# Patient Record
Sex: Female | Born: 1960 | Race: White | Hispanic: No | Smoking: Never smoker
Health system: Southern US, Community
[De-identification: ages and names within clinical notes are randomized; demographics above are authoritative.]

## PROBLEM LIST (undated history)

## (undated) DIAGNOSIS — E559 Vitamin D deficiency, unspecified: Secondary | ICD-10-CM

## (undated) DIAGNOSIS — I8393 Asymptomatic varicose veins of bilateral lower extremities: Secondary | ICD-10-CM

## (undated) DIAGNOSIS — N979 Female infertility, unspecified: Secondary | ICD-10-CM

## (undated) DIAGNOSIS — F419 Anxiety disorder, unspecified: Secondary | ICD-10-CM

## (undated) DIAGNOSIS — H539 Unspecified visual disturbance: Secondary | ICD-10-CM

## (undated) DIAGNOSIS — K8020 Calculus of gallbladder without cholecystitis without obstruction: Secondary | ICD-10-CM

## (undated) DIAGNOSIS — L309 Dermatitis, unspecified: Secondary | ICD-10-CM

## (undated) DIAGNOSIS — R439 Unspecified disturbances of smell and taste: Secondary | ICD-10-CM

## (undated) DIAGNOSIS — G4700 Insomnia, unspecified: Secondary | ICD-10-CM

## (undated) DIAGNOSIS — I10 Essential (primary) hypertension: Secondary | ICD-10-CM

## (undated) DIAGNOSIS — R7303 Prediabetes: Secondary | ICD-10-CM

## (undated) DIAGNOSIS — G902 Horner's syndrome: Secondary | ICD-10-CM

## (undated) HISTORY — DX: Dermatitis, unspecified: L30.9

## (undated) HISTORY — DX: Calculus of gallbladder without cholecystitis without obstruction: K80.20

## (undated) HISTORY — DX: Vitamin D deficiency, unspecified: E55.9

## (undated) HISTORY — DX: Unspecified visual disturbance: H53.9

## (undated) HISTORY — DX: Essential (primary) hypertension: I10

## (undated) HISTORY — DX: Unspecified disturbances of smell and taste: R43.9

## (undated) HISTORY — DX: Prediabetes: R73.03

## (undated) HISTORY — DX: Horner's syndrome: G90.2

## (undated) HISTORY — DX: Anxiety disorder, unspecified: F41.9

## (undated) HISTORY — DX: Female infertility, unspecified: N97.9

## (undated) HISTORY — DX: Insomnia, unspecified: G47.00

## (undated) HISTORY — DX: Asymptomatic varicose veins of bilateral lower extremities: I83.93

---

## 1998-06-05 ENCOUNTER — Other Ambulatory Visit: Admission: RE | Admit: 1998-06-05 | Discharge: 1998-06-05 | Payer: Self-pay | Admitting: *Deleted

## 1998-08-05 ENCOUNTER — Emergency Department (HOSPITAL_COMMUNITY): Admission: EM | Admit: 1998-08-05 | Discharge: 1998-08-05 | Payer: Self-pay | Admitting: Emergency Medicine

## 1998-08-06 ENCOUNTER — Encounter: Payer: Self-pay | Admitting: Emergency Medicine

## 1999-07-07 ENCOUNTER — Other Ambulatory Visit: Admission: RE | Admit: 1999-07-07 | Discharge: 1999-07-07 | Payer: Self-pay | Admitting: *Deleted

## 2001-05-31 ENCOUNTER — Other Ambulatory Visit: Admission: RE | Admit: 2001-05-31 | Discharge: 2001-05-31 | Payer: Self-pay | Admitting: Obstetrics and Gynecology

## 2002-04-22 ENCOUNTER — Encounter: Admission: RE | Admit: 2002-04-22 | Discharge: 2002-04-22 | Payer: Self-pay | Admitting: Family Medicine

## 2002-04-23 ENCOUNTER — Encounter: Payer: Self-pay | Admitting: Family Medicine

## 2002-04-23 ENCOUNTER — Encounter: Admission: RE | Admit: 2002-04-23 | Discharge: 2002-04-23 | Payer: Self-pay | Admitting: Family Medicine

## 2003-06-11 ENCOUNTER — Other Ambulatory Visit: Admission: RE | Admit: 2003-06-11 | Discharge: 2003-06-11 | Payer: Self-pay | Admitting: Obstetrics and Gynecology

## 2003-07-11 ENCOUNTER — Encounter: Payer: Self-pay | Admitting: Obstetrics and Gynecology

## 2003-07-11 ENCOUNTER — Ambulatory Visit (HOSPITAL_COMMUNITY): Admission: RE | Admit: 2003-07-11 | Discharge: 2003-07-11 | Payer: Self-pay | Admitting: Obstetrics and Gynecology

## 2003-08-12 ENCOUNTER — Encounter: Payer: Self-pay | Admitting: Obstetrics and Gynecology

## 2003-08-12 ENCOUNTER — Ambulatory Visit (HOSPITAL_COMMUNITY): Admission: RE | Admit: 2003-08-12 | Discharge: 2003-08-12 | Payer: Self-pay | Admitting: Obstetrics and Gynecology

## 2003-09-09 ENCOUNTER — Ambulatory Visit (HOSPITAL_COMMUNITY): Admission: RE | Admit: 2003-09-09 | Discharge: 2003-09-09 | Payer: Self-pay | Admitting: Obstetrics and Gynecology

## 2003-09-09 ENCOUNTER — Encounter: Payer: Self-pay | Admitting: Obstetrics and Gynecology

## 2003-09-19 ENCOUNTER — Ambulatory Visit (HOSPITAL_COMMUNITY): Admission: RE | Admit: 2003-09-19 | Discharge: 2003-09-19 | Payer: Self-pay | Admitting: Obstetrics and Gynecology

## 2003-09-26 ENCOUNTER — Inpatient Hospital Stay (HOSPITAL_COMMUNITY): Admission: AD | Admit: 2003-09-26 | Discharge: 2003-09-26 | Payer: Self-pay | Admitting: Obstetrics and Gynecology

## 2003-10-07 ENCOUNTER — Ambulatory Visit (HOSPITAL_COMMUNITY): Admission: RE | Admit: 2003-10-07 | Discharge: 2003-10-07 | Payer: Self-pay | Admitting: Obstetrics and Gynecology

## 2003-10-07 ENCOUNTER — Encounter: Admission: RE | Admit: 2003-10-07 | Discharge: 2003-10-07 | Payer: Self-pay | Admitting: Obstetrics and Gynecology

## 2003-10-14 ENCOUNTER — Encounter: Admission: RE | Admit: 2003-10-14 | Discharge: 2003-10-14 | Payer: Self-pay | Admitting: Obstetrics and Gynecology

## 2003-10-21 ENCOUNTER — Encounter: Admission: RE | Admit: 2003-10-21 | Discharge: 2003-10-21 | Payer: Self-pay | Admitting: Obstetrics and Gynecology

## 2003-10-24 ENCOUNTER — Encounter: Admission: RE | Admit: 2003-10-24 | Discharge: 2003-10-24 | Payer: Self-pay | Admitting: Obstetrics and Gynecology

## 2003-10-27 ENCOUNTER — Encounter: Admission: RE | Admit: 2003-10-27 | Discharge: 2003-10-27 | Payer: Self-pay | Admitting: Obstetrics and Gynecology

## 2003-10-27 ENCOUNTER — Inpatient Hospital Stay (HOSPITAL_COMMUNITY): Admission: AD | Admit: 2003-10-27 | Discharge: 2003-11-04 | Payer: Self-pay | Admitting: Obstetrics and Gynecology

## 2003-10-30 ENCOUNTER — Encounter (INDEPENDENT_AMBULATORY_CARE_PROVIDER_SITE_OTHER): Payer: Self-pay

## 2003-11-05 ENCOUNTER — Encounter: Admission: RE | Admit: 2003-11-05 | Discharge: 2003-12-05 | Payer: Self-pay | Admitting: Obstetrics and Gynecology

## 2003-11-29 IMAGING — US US OB FOLLOW-UP EACH ADDL GEST (MODIFY)
1 series · 17 of 28 positions shown · non-contrast
Comparison: none

CLINICAL DATA: Pregnancy induced hypertension.  Assess presentation and estimated fetal weight.

[Series 1: us ob re-eval · 17 of 58 slices shown]
[im 1/58]
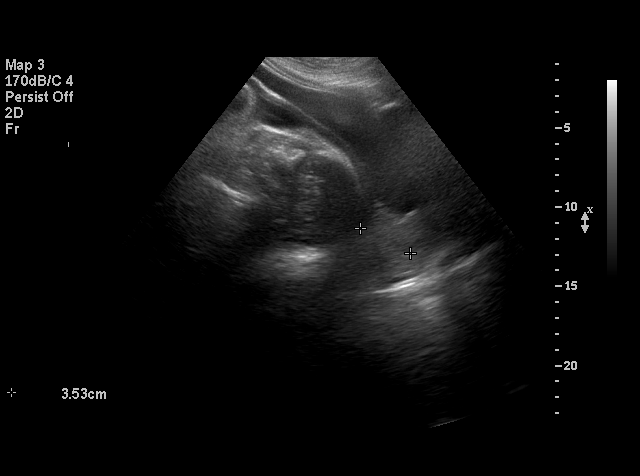
[im 5/58]
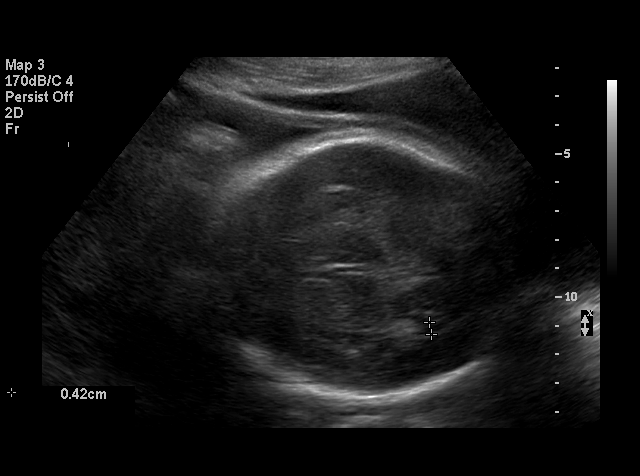
[im 9/58]
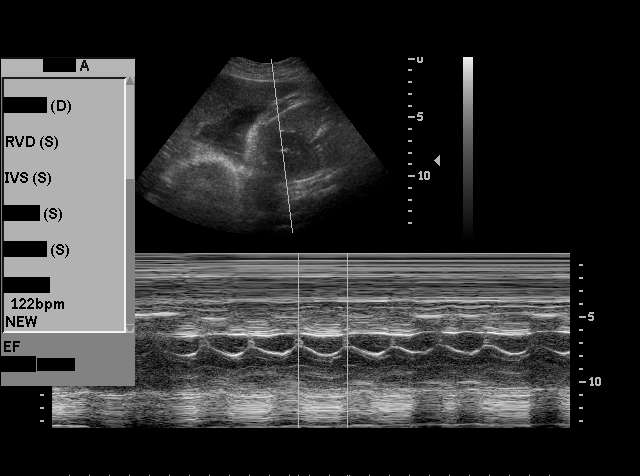
[im 11/58]
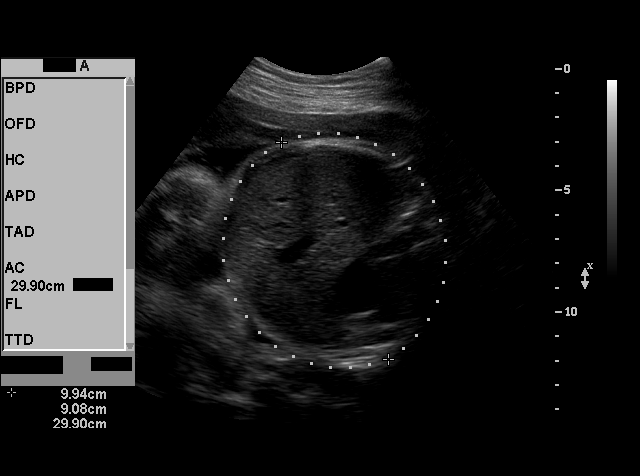
[im 15/58]
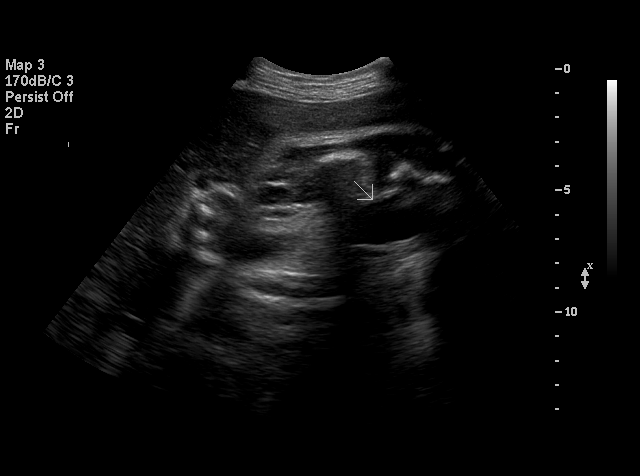
[im 20/58]
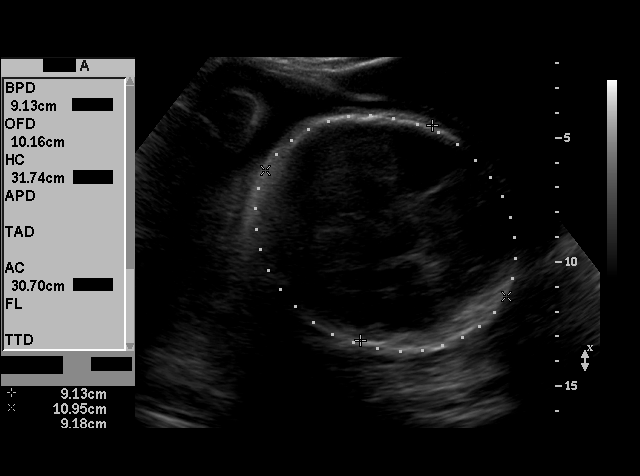
[im 22/58]
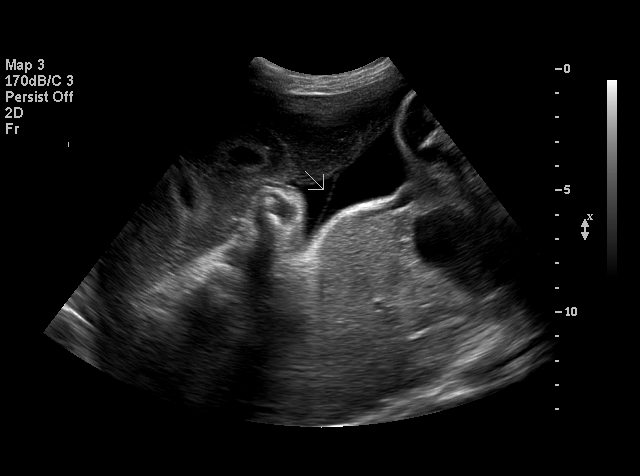
[im 26/58]
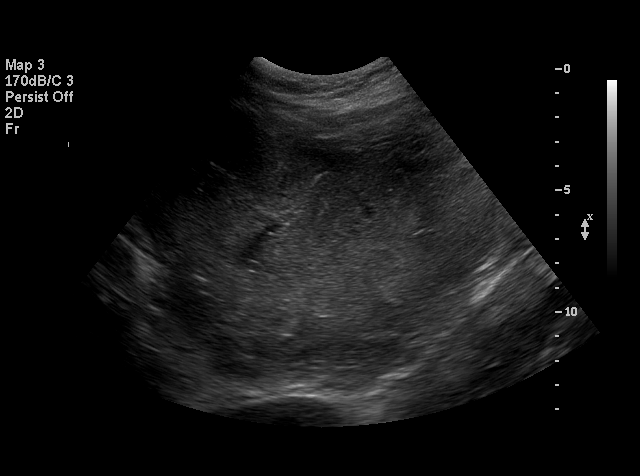
[im 30/58]
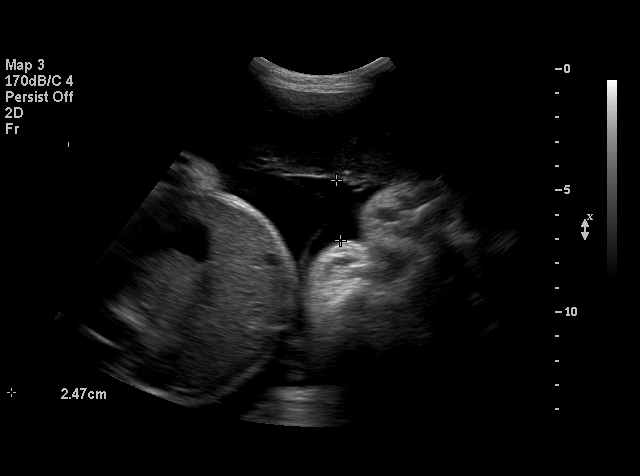
[im 32/58]
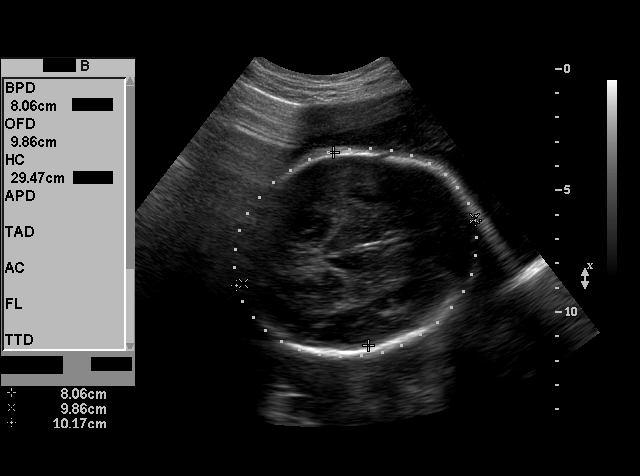
[im 36/58]
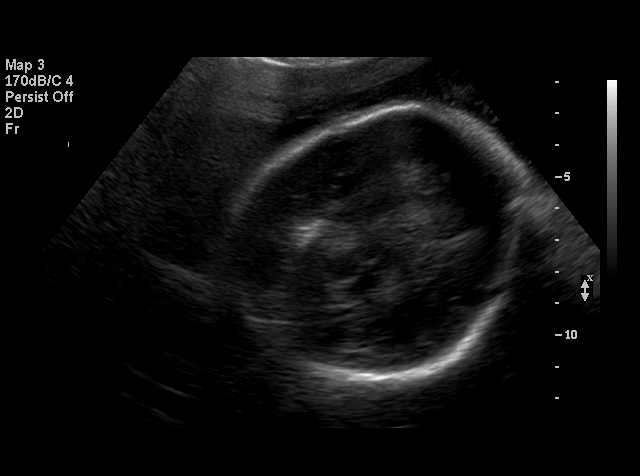
[im 39/58]
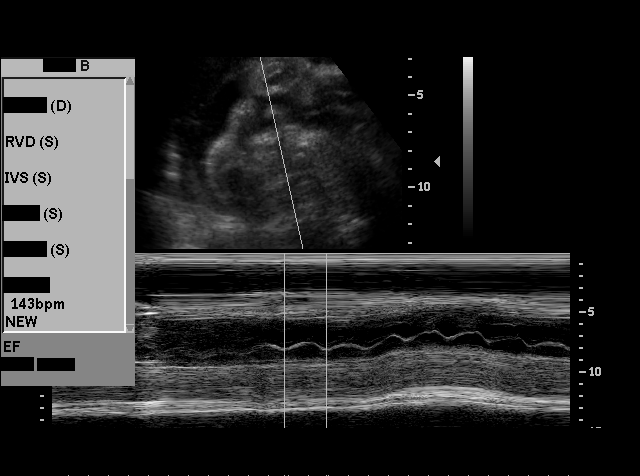
[im 43/58]
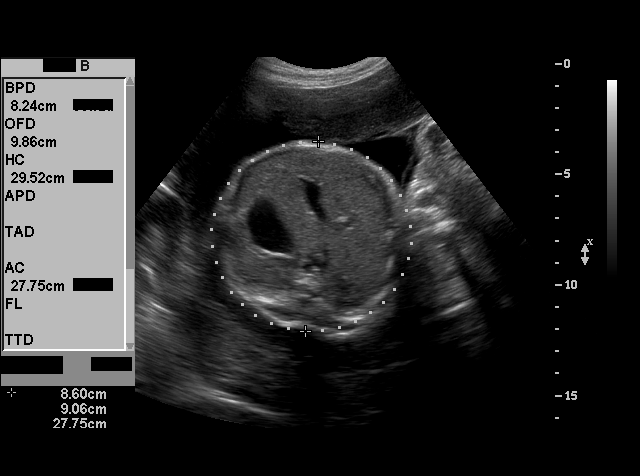
[im 47/58]
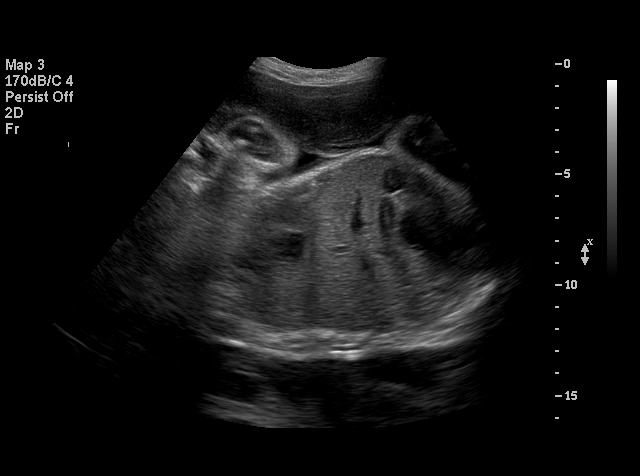
[im 49/58]
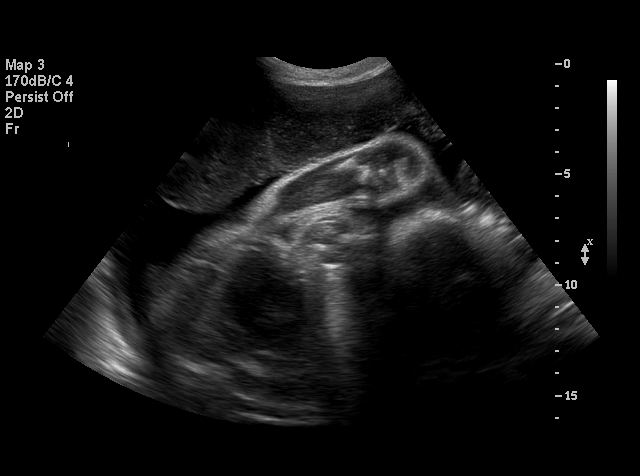
[im 53/58]
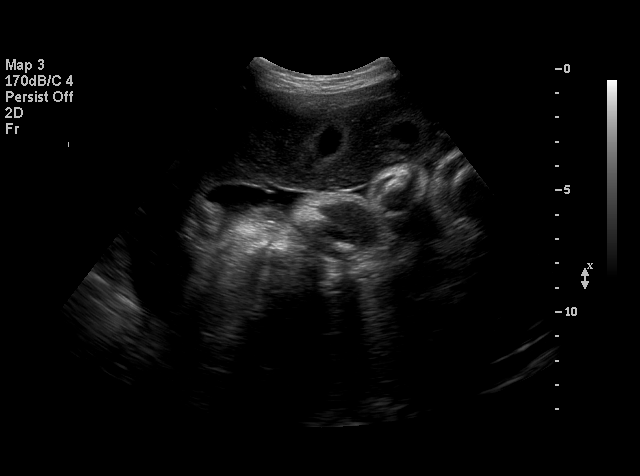
[im 58/58]
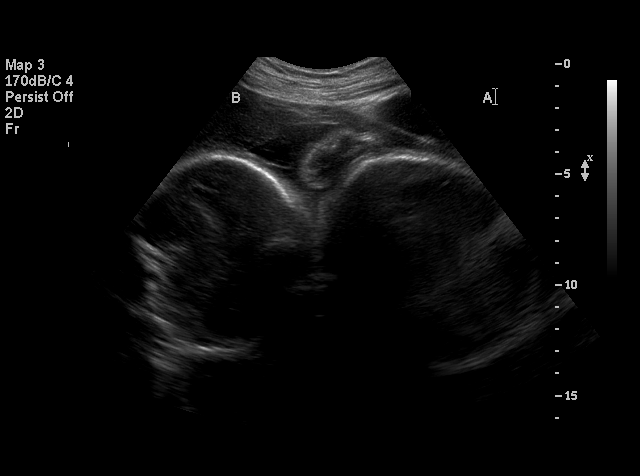

[17 of 28 positions shown; findings below may reference images not displayed]

TWIN OBSTETRICAL ULTRASOUND REEVALUATION
A living dizygotic/dichorionic/diamniotic twin gestation is present.  A separating membrane is seen.

TWIN A:
Heart Rate: 122
Movement:  Yes
Breathing:  Yes
Presentation:  Cephalic
Placental Location:  Anterior
Grade:  II
Previa:  No
Amniotic Fluid (Subjective):  Normal
Amniotic Fluid (Objective):   2.5 cm Vertical pocket 

FETAL BIOMETRY (TWIN A)
BPD:  8.9 cm  36 w 2 d
HC:  32.0 cm  36 w 1 d
AC:  30.5 cm  34 w 4 d
FL:  6.3 cm   32 w 6 d
MEAN GA:  35 w 0 d
1st US GA:  33 w 5 d (assigned)
EFW:  2429 g (H) 7668 ? 4833 g) For 34 weeks

FETAL ANATOMY (TWIN A)
Lateral Ventricles:  Visualized 
Thalami/CSP:  Previously seen 
Posterior Fossa:  Previously seen 
Nuchal Region:  N/A
Spine:  Previously seen 
4 Chamber Heart on L:  Visualized 
Stomach on Left:  Visualized 
3 Vessel Cord:  Visualized 
Cord Insertion Site:  Previously seen 
Kidneys:  Visualized 
Bladder:  Visualized   
Extremities:  Previously seen 

TWIN B:
Heart Rate:  143 
Movement:  Yes
Breathing:  Yes
Presentation:  Oblique with head on maternal right
Placental Location:  Posterior
Grade:  II
Previa:  No
Amniotic Fluid (Subjective):  Normal
Amniotic Fluid (Objective):   3.2 cm Vertical pocket 

FETAL BIOMETRY (TWIN B)
BPD:  8.2 cm  32 w 6 d
HC:  29.4 cm  32 w 4 d
AC:  29.0 cm  33 w 0 d
FL:  6.2 cm    32 w 0 d
MEAN GA:  32 w 5 d
1st US GA:  33 w 5 d (assigned)
EFW:  7883 g (H) 3989 ? 7668 g)  For 34 weeks

FETAL ANATOMY (TWIN B)
Lateral Ventricles:  Visualized 
Thalami/CSP:  Previously seen 
Posterior Fossa:  Previously seen 
Nuchal Region:  N/A
Spine:  Previously seen 
4 Chamber Heart on L:  Visualized 
Stomach on Left:  Visualized 
3 Vessel Cord:  Visualized 
Cord Insertion Site:  Previously seen 
Kidneys:  Visualized 
Bladder:  Visualized 
Extremities:  Previously seen 

Maternal Findings:
Cervix:  3.5 cm Transabdominally
IMPRESSION: Living dizygotic/dichorionic/diamniotic twin pregnancy of 33 weeks 5 days gestational age.  Twin A measures 35 weeks with a weight of 2429 grams falling between the 50th and 75th percentile for 34 weeks.  Twin B measures 32 weeks 5 days with a fetal weight of 7883 grams, falling between the 25th and 50th percentile for 34 weeks.  Growth is felt to be appropriate.
Normal fluid in each sac.
Twin A is in cephalic presentation in the inferior portion uterus.  Twin B is in oblique presentation with head on maternal right in the superior portion of the uterus.  
Normal cervical length of 3.5 cm.

## 2003-12-06 ENCOUNTER — Encounter: Admission: RE | Admit: 2003-12-06 | Discharge: 2004-01-05 | Payer: Self-pay | Admitting: Obstetrics and Gynecology

## 2004-01-06 ENCOUNTER — Encounter: Admission: RE | Admit: 2004-01-06 | Discharge: 2004-02-05 | Payer: Self-pay | Admitting: Obstetrics and Gynecology

## 2004-06-11 ENCOUNTER — Encounter: Admission: RE | Admit: 2004-06-11 | Discharge: 2004-06-11 | Payer: Self-pay | Admitting: Obstetrics and Gynecology

## 2004-06-28 ENCOUNTER — Other Ambulatory Visit: Admission: RE | Admit: 2004-06-28 | Discharge: 2004-06-28 | Payer: Self-pay | Admitting: Obstetrics and Gynecology

## 2004-07-01 ENCOUNTER — Encounter: Admission: RE | Admit: 2004-07-01 | Discharge: 2004-07-01 | Payer: Self-pay | Admitting: Obstetrics and Gynecology

## 2005-07-08 ENCOUNTER — Other Ambulatory Visit: Admission: RE | Admit: 2005-07-08 | Discharge: 2005-07-08 | Payer: Self-pay | Admitting: Obstetrics and Gynecology

## 2008-06-27 ENCOUNTER — Encounter: Admission: RE | Admit: 2008-06-27 | Discharge: 2008-06-27 | Payer: Self-pay | Admitting: Family Medicine

## 2008-07-24 ENCOUNTER — Other Ambulatory Visit: Admission: RE | Admit: 2008-07-24 | Discharge: 2008-07-24 | Payer: Self-pay | Admitting: Family Medicine

## 2008-08-01 ENCOUNTER — Encounter: Admission: RE | Admit: 2008-08-01 | Discharge: 2008-08-01 | Payer: Self-pay | Admitting: Family Medicine

## 2010-12-25 ENCOUNTER — Encounter: Payer: Self-pay | Admitting: Obstetrics and Gynecology

## 2011-02-23 ENCOUNTER — Other Ambulatory Visit (HOSPITAL_COMMUNITY)
Admission: RE | Admit: 2011-02-23 | Discharge: 2011-02-23 | Disposition: A | Discharge status: Home / Self Care | Payer: BC Managed Care – PPO | Source: Ambulatory Visit | Attending: Family Medicine | Admitting: Family Medicine

## 2011-02-23 ENCOUNTER — Other Ambulatory Visit: Payer: Self-pay | Admitting: Family Medicine

## 2011-02-23 DIAGNOSIS — Z0000 Encounter for general adult medical examination without abnormal findings: Secondary | ICD-10-CM | POA: Insufficient documentation

## 2011-04-22 NOTE — H&P (Signed)
Brenda Rubio, Brenda Rubio                      ACCOUNT NO.:  [id number]   MEDICAL RECORD NO.:  [id number]                   PATIENT TYPE:  INP   LOCATION:  NA                                   FACILITY:  [facility]   PHYSICIAN:  Huel Cote, M.D.              DATE OF BIRTH:  11-10-1961   DATE OF ADMISSION:  10/27/2003  DATE OF DISCHARGE:                                HISTORY & PHYSICAL   REASON FOR ADMISSION:  Preeclampsia and a twin gestation.   HISTORY OF PRESENT ILLNESS:  The patient is a 50 year old, G1, P0, at 33  weeks and 3 days with a twin gestation conceived by in vitro fertilization  with donor eggs.  Brenda Rubio has been followed for hypertension with  pregnancy for the last several weeks, first noted at 29 weeks.  She was  placed on Aldomet 250 mg b.i.d. on  October 06, 2003, and had some response.  She also had a baseline 24-hour urine and baseline preeclamptic laboratories  performed, which were within normal limits.  Her 24-hour urine had 125 mg of  protein.  That was performed on October 07, 2003.  Currently the patient was  seen in the office today and had a blood pressure of 170/105.  Her weight  was notably increased at 197 pounds, which was approximately 10 pounds  heavier than last week this time.  She had been increased on her Aldomet to  500 mg b.i.d. last week and had gone to half days at work.  However, her  blood pressure did not appear to be responding to this.  Also, the patient  had 3+ proteinuria noted and this was entirely new for her.  She denied any  preeclamptic symptoms other than the significant lower extremity edema.  She  had no headaches, vision changes, or epigastric pain.  The last ultrasound  performed on October 07, 2003, revealed baby A to be vertex with an  estimated fetal weight of 1949 g and baby B was breech in the oblique  position with a weight of 1578 g and both appeared appropriately grown.   PAST OBSTETRICAL HISTORY:  None.   PRENATAL LABORATORY DATA:  A negative.  Antibody negative.  RPR nonreactive.  Rubella immune.  Hepatitis B surface antigen negative.  HIV negative.  GC  negative.  Chlamydia negative.  Group B Streptococcus is as yet unknown.   PAST GYNECOLOGICAL HISTORY:  Significant for premature ovarian failure.  The  patient did conceive with in vitro fertilization and donor eggs.   PAST MEDICAL HISTORY:  Iron deficiency anemia.  Otherwise none significant.   PAST SURGICAL HISTORY:  Wisdom teeth extraction only.   ALLERGIES:  PENICILLIN.   MEDICATIONS:  Currently the patient is taking Aldomet 500 mg p.o. b.i.d. and  her prenatal vitamins.   PHYSICAL EXAMINATION:  VITAL SIGNS:  The patient's blood pressure in the  office was 150/100.  EXTREMITIES:  She had 3+  pitting edema of the entire lower extremities from  the knees down.  DTRs were 2+ bilaterally.  CARDIAC:  Regular rate and rhythm.  LUNGS:  Clear.  ABDOMEN:  Gravida and nontender.  The fundal height is approximately 40.  She had a nonstress test which was reactive in the morning of admission.   IMPRESSION AND PLAN:  Given the patient's lack of response to increasing her  Aldomet and her weight gain with new proteinuria, she is being admitted for  presumptive preeclampsia.  She will have laboratories obtained upon  admission and a 24-hour urine begun, as well as blood pressure observation.  Should the patient's blood pressure appeared excessively elevated, we will  begin her on magnesium and potentially move towards delivery.  We will also  proceed with betamethasone injection now and again in 24 hours.  Pending the  results of her laboratories, we will make a decision whether delivery needs  to be imminent or if we can hold off to allow the fetuses more time to  mature.                                               Huel Cote, M.D.    KR/MEDQ  D:  10/27/2003  T:  10/27/2003  Job:  [id number]

## 2011-04-22 NOTE — Discharge Summary (Signed)
Brenda Rubio, Brenda Rubio                      ACCOUNT NO.:  [id number]   MEDICAL RECORD NO.:  [id number]                   PATIENT TYPE:  INP   LOCATION:  9110                                 FACILITY:  [facility]   PHYSICIAN:  Huel Cote, M.D.              DATE OF BIRTH:  Oct 25, 1961   DATE OF ADMISSION:  10/27/2003  DATE OF DISCHARGE:  11/04/2003                                 DISCHARGE SUMMARY   DISCHARGE DIAGNOSES:  1. Twin gestation at 34 weeks delivered.  2. Pregnancy conceived with donor egg in vitro fertilization.  3. Severe preeclampsia.  4. Failed induction.  5. Status post primary low transverse cesarean section.   DISCHARGE MEDICATIONS:  1. Motrin 600 mg p.o. q.6h.  2. Percocet one to two tablets p.o. q.4h. p.r.n.  3. Niferex 150 mg p.o. daily.  4. Labetalol 200 mg p.o. b.i.d. with blood pressure check in one week.   DISCHARGE FOLLOW-UP:  The patient is to return to the office in one week for  a blood pressure check or call in her blood pressures from her visiting  nurse.   HOSPITAL COURSE:  The patient is a 50 year old G1 P0 who is admitted at 51  weeks three days with a twin gestation conceived by in vitro fertilization  with donor eggs.  She had been followed for PIH over the last several weeks,  first noted at [redacted] weeks gestation, and was placed on Aldomet 250 mg b.i.d.  with some response.  She had a baseline 24-hour urine and preeclamptic labs  which were normal and her 24-hour urine at that time had 125 mg of protein  on October 07, 2003.  When the patient came in for her routine visit she was  noted to have a blood pressure of 170/105 and her weight had increased 10  pounds over one week.  Her urine was also significant for 3+ proteinuria.  She denied any preeclamptic symptoms, vision changes, or other issues.  Her  last ultrasound performed on November 2 revealed concordant twins with good  growth, baby A vertex and baby B breech.  Past obstetrical  history:  None.  Prenatal labs:  A negative, antibody negative, RPR nonreactive,  rubella immune, hepatitis B surface antigen negative, HIV negative, GC  negative, chlamydia negative, group B strep unknown.  Past gynecological  history:  Premature ovarian failure with this pregnancy conceived with in  vitro fertilization and donor eggs.  Past medical history:  Iron deficiency  anemia.  Past surgical history:  Wisdom teeth only.  Allergies:  PENICILLIN.  Medications:  The patient on admission was taking Aldomet 500 mg p.o. b.i.d.  On admission blood pressure was 150/100.  Cardiac exam was regular rate and  rhythm.  Lungs were clear.  Abdomen was gravid and nontender, fundal height  was 40.  She had 3+ pitting edema of the entire lower extremity from the  knee down.  The patient was admitted  with hospital course as follows.  She  received betamethasone and was given some IV labetalol to control blood  pressures which got as high as 170/110 and was placed on magnesium for  seizure prophylaxis.  The patient's labs on admission were significant for a  platelet count which was slightly diminished to 132, a uric acid was 6.7,  AST and ALT were 28 and 33, hemoglobin was 12.3.  The patient had labs which  were repeated serially given her platelet count, which stabilized at about  120,000.  After the patient had received betamethasone and this had been  onboard for 24 hours the decision was made to proceed with delivery, given  increasing pedal edema and no further significant benefits to be obtained by  maintaining the patient's pregnancy after betamethasone and 34 weeks had  been reached.  The patient was given attempts at ripening cervix with  Cervidil and Cytotec; however, her cervix never responded and the patient's  edema had progressed to 3+ all the way up to her groin area such that it was  almost impossible that she bend her lower extremities.  Given her worsening  blood pressures which were  now 170/106 on labetalol and a cervix which was  unchanged by attempts at ripening for 36 hours the patient's situation was  discussed with she and her husband and we recommended proceeding with a  cesarean section given that she was remote from delivery and her condition  seemed to be worsening.  The patient agreed and she underwent a low  transverse cesarean section on October 30, 2003 and was delivered of two  vigorous infants.  Twin A delivered vertex with a nuchal cord x1, was a  vigorous female, Apgars were 8 and 9, weight was 4 pounds 13 ounces.  Twin B  delivered vertex, weight was  4 pounds 0 ounces, with Apgars of 8 and 9, a vigorous female infant.  Placenta was delivered manually.  She was placed on magnesium postpartum and  immediately postpartum had several large clots evacuated from the uterus  with a postoperative day #1 hemoglobin of 7.2.  Her labs did bump slightly  with an SGOT of 61, SGPT of 48, platelets were stable at 141, creatinine was  1.1.  We continued to follow her labs and continued magnesium for over 24  hours for seizure prophylaxis until the patient began to diurese well.  Her  labs stabilized and she began to tolerate a regular diet and otherwise was  doing very well with her pain control.  She had a low-grade temperature of  100.4 prior to discharge; however, the day of discharge this had not been  present for approximately 24 hours.  Her blood pressure was stable at 150 to  160 over 90s on p.o. labetalol 200 b.i.d.  Her incision was clean, dry, and  intact.  There was excellent urine output and no further issues.  Her  hematocrit stabilized at 18.5 and though the patient was given the option of  blood transfusion she felt very well clinically and declined this option.  Therefore, on November 30 the patient was tolerating a regular diet, her  pain was well controlled, she was ambulating without difficulty, and was felt stable for discharge home.  Therefore,  she was discharged with follow-  up in the office planned in one week for a blood pressure check.  Huel Cote, M.D.    KR/MEDQ  D:  12/03/2003  T:  12/03/2003  Job:  [id number]

## 2011-04-22 NOTE — Op Note (Signed)
NAMEMACKENSI, MAHADEO                      ACCOUNT NO.:  [id number]   MEDICAL RECORD NO.:  [id number]                   PATIENT TYPE:  INP   LOCATION:  9175                                 FACILITY:  [facility]   PHYSICIAN:  Huel Cote, M.D.              DATE OF BIRTH:  1961/11/13   DATE OF PROCEDURE:  10/30/2003  DATE OF DISCHARGE:                                 OPERATIVE REPORT   PREOPERATIVE DIAGNOSES:  1. Twin gestation at 34 weeks.  2. Pregnancy conceived with donor egg in vitro fertilization.  3. Severe preeclampsia.  4. Failed induction.   POSTOPERATIVE DIAGNOSES:  1. Twin gestation at 34 weeks.  2. Pregnancy conceived with donor egg in vitro fertilization.  3. Severe preeclampsia.  4. Failed induction.   PROCEDURE:  Primary low transverse cesarean section.   SURGEON:  Huel Cote, M.D.   ANESTHESIA:  Spinal.   FLUIDS:  Estimated blood loss 800 mL, urine output approximately 100 mL  clear urine, IV fluids 1400 mL LR.   FINDINGS:  Vigorous twin infants were delivered.  Baby A was vertex with a  nuchal cord x1, Apgars were 8 and 9, weight was 4 pounds 13 ounces.  Baby B  was also delivered vertex, a vigorous female infant, Apgars were 8 and 9,  and weight was 4 pounds even.  Placenta delivered manually.  Cord clamp was  placed on A for identification.  Uterus, tubes, and ovaries were noted to be  normal.   PATHOLOGY:  The placenta was sent.   DESCRIPTION OF PROCEDURE:  The patient was taken to the operating room,  where spinal anesthesia was obtained without difficulty.  She was then  prepped and draped in the normal sterile fashion in the dorsal supine  position with a leftward tilt.  With the Foley catheter in place, a  Pfannenstiel skin incision was then made through the skin and carried  through to the underlying layer of fascia with Bovie cautery.  The fascia  was then nicked in the midline and the incision was extended laterally with  Mayo scissors.   The inferior aspect of the incision was grasped with Kocher  clamps, elevated, and dissected off the underlying rectus muscles.  In a  similar fashion the superior aspect of the fascia was taken off the rectus  muscles with the Bovie cautery.  The rectus muscles were separated in the  midline and the peritoneal cavity entered bluntly.  The peritoneal incision  was then extended both superiorly and inferiorly with careful attention to  avoid both bowel and bladder.  The bladder blade was then inserted and the  vesicouterine peritoneum identified and the bladder flap created with  Metzenbaum scissors.  With the bladder flap then under the bladder blade,  the lower uterine segment was incised in a transverse fashion and the  incision was extended manually.  The bag of water was noted to be clear up  on  entering the uterine cavity and the infant's head was delivered  atraumatically.  There was a nuchal cord that was reduced x1.  The infant  was bulb-suctioned with the remainder of the body delivered and cord clamped  and handed to the waiting pediatricians.  With the cord clamp in place,  attention was then turned to baby B, where the head was easily palpated  through the intact membranes.  The head was partially delivered to the  uterine incision and membranes ruptured with clear fluid also noted.  Baby  B, a vigorous female infant, was then delivered from the vertex presentation  without difficulty, her cord clamped and cut, and she was bulb-suctioned and  handed to the awaiting pediatricians as well.  Cord blood was then obtained  from cord A and cord B, and the placentas were delivered manually.  The  uterus was cleared of all clots and debris with a moist lap sponge and  contracted down nicely.  The uterine incision was then repaired with 0  Vicryl in a running locked fashion.  At the conclusion of the closure no  active bleeding was noted.  Ovaries and tubes were inspected and found to  be  normal bilaterally, and the abdomen and pelvis were irrigated and no  bleeding again noted.  Therefore, the rectus muscles were closed with  several interrupted sutures of 0 chromic.  The fascia was then closed with 0  Vicryl in a running fashion.  The subcutaneous tissue was reapproximated  with 3-0 Vicryl in a subcutaneous stitch, and finally the skin was closed  with staples.  Sponge, lap, and needle counts were correct x2 and the  patient was taken to the recovery room in stable condition.                                               Huel Cote, M.D.    KR/MEDQ  D:  10/30/2003  T:  10/30/2003  Job:  [id number]

## 2011-11-09 ENCOUNTER — Other Ambulatory Visit: Payer: Self-pay | Admitting: Family Medicine

## 2011-11-09 DIAGNOSIS — G44009 Cluster headache syndrome, unspecified, not intractable: Secondary | ICD-10-CM

## 2011-11-12 ENCOUNTER — Ambulatory Visit
Admission: RE | Admit: 2011-11-12 | Discharge: 2011-11-12 | Disposition: A | Discharge status: Home / Self Care | Payer: BC Managed Care – PPO | Source: Ambulatory Visit | Attending: Family Medicine | Admitting: Family Medicine

## 2011-11-12 DIAGNOSIS — G44009 Cluster headache syndrome, unspecified, not intractable: Secondary | ICD-10-CM

## 2011-11-12 MED ORDER — GADOBENATE DIMEGLUMINE 529 MG/ML IV SOLN
12.0000 mL | Freq: Once | INTRAVENOUS | Status: AC | PRN
  Administered 2011-11-12: 12 mL via INTRAVENOUS

## 2012-06-08 ENCOUNTER — Other Ambulatory Visit: Payer: Self-pay | Admitting: *Deleted

## 2012-06-08 DIAGNOSIS — I7771 Dissection of carotid artery: Secondary | ICD-10-CM

## 2012-06-12 ENCOUNTER — Ambulatory Visit
Admission: RE | Admit: 2012-06-12 | Discharge: 2012-06-12 | Disposition: A | Discharge status: Home / Self Care | Payer: BC Managed Care – PPO | Source: Ambulatory Visit | Attending: *Deleted | Admitting: *Deleted

## 2012-06-12 DIAGNOSIS — I7771 Dissection of carotid artery: Secondary | ICD-10-CM

## 2014-02-25 ENCOUNTER — Other Ambulatory Visit: Payer: Self-pay | Admitting: Family Medicine

## 2014-02-25 DIAGNOSIS — R109 Unspecified abdominal pain: Secondary | ICD-10-CM

## 2014-03-03 ENCOUNTER — Ambulatory Visit
Admission: RE | Admit: 2014-03-03 | Discharge: 2014-03-03 | Disposition: A | Discharge status: Home / Self Care | Payer: BC Managed Care – PPO | Source: Ambulatory Visit | Attending: Family Medicine | Admitting: Family Medicine

## 2014-03-03 ENCOUNTER — Other Ambulatory Visit: Payer: BC Managed Care – PPO

## 2014-03-03 DIAGNOSIS — R109 Unspecified abdominal pain: Secondary | ICD-10-CM

## 2014-03-03 MED ORDER — IOHEXOL 300 MG/ML  SOLN
100.0000 mL | Freq: Once | INTRAMUSCULAR | Status: AC | PRN
  Administered 2014-03-03: 100 mL via INTRAVENOUS

## 2014-03-06 ENCOUNTER — Other Ambulatory Visit: Payer: BC Managed Care – PPO

## 2015-02-10 ENCOUNTER — Other Ambulatory Visit (HOSPITAL_COMMUNITY)
Admission: RE | Admit: 2015-02-10 | Discharge: 2015-02-10 | Disposition: A | Discharge status: Home / Self Care | Payer: No Typology Code available for payment source | Source: Ambulatory Visit | Attending: Family Medicine | Admitting: Family Medicine

## 2015-02-10 ENCOUNTER — Other Ambulatory Visit: Payer: Self-pay | Admitting: Family Medicine

## 2015-02-10 DIAGNOSIS — Z01419 Encounter for gynecological examination (general) (routine) without abnormal findings: Secondary | ICD-10-CM | POA: Diagnosis present

## 2015-02-12 LAB — CYTOLOGY - PAP

## 2015-08-07 ENCOUNTER — Ambulatory Visit (INDEPENDENT_AMBULATORY_CARE_PROVIDER_SITE_OTHER): Payer: PRIVATE HEALTH INSURANCE | Admitting: Neurology

## 2015-08-07 ENCOUNTER — Encounter: Payer: Self-pay | Admitting: Neurology

## 2015-08-07 VITALS — BP 146/90 | HR 80 | Ht 66.0 in | Wt 145.6 lb

## 2015-08-07 DIAGNOSIS — H5310 Unspecified subjective visual disturbances: Secondary | ICD-10-CM

## 2015-08-07 DIAGNOSIS — H53141 Visual discomfort, right eye: Secondary | ICD-10-CM

## 2015-08-07 NOTE — Patient Instructions (Addendum)
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 She has no complaints of eye strain which is of unclear significance. I would recommend she see her ophthalmologist for that and I will make the referral. She was advised to continue aspirin 81 mg daily lifelong and maintain strict control of hypertension.Avoid straining, lifting heavy weights and neck manipulation. She may return for follow-up in the future only as necessary and no routine follow-up appointment was made.

## 2015-08-07 NOTE — Progress Notes (Signed)
[facility] [address] [place]. [place] [address] [phone]       OFFICE CONSULT NOTE  Brenda. Brenda Rubio Date of Birth:  May 23, 1961 Medical Record Number:  [id number]   Referring MD: Brenda Rubio Reason for Referral:  Horner`s syndrome  HPI: Brenda Rubio is a53 year pleasant Caucasian lady who is referred back to me for follow-up for her remote right Horner's syndrome and carotid dissection. She was actually initially seen by me 12 years ago when she had some symptoms of head sensations in her headaches I unfortunately do not have those records but apparently at tried on Depakote which he took for a while and the symptoms went away and stopped. She was seen back in office in 2012 when 1 day she woke up with a headache and noticed right eye drooping and small pupillary size. Dr. Lynnae Rubio I saw her and she was found to have a right carotid dissection but with preserved flow. This was treated conservatively and she did quite well. She subsequently had follow-up MRI and MRA which showed interval recanalization of the dissection. She has continued to do well but has sought multiple opinions for dissection and Horner's syndrome and has been seen at [facility] as well as [facility] as well as more recently [facility] and no specific etiology of the dissection was found and she was recommended to stay on aspirin and conservative follow-up. She denies any new focal neurovascular symptoms. Her main complaint is a feeling of eye strain more in the right eye and sometimes in the left as well. Very rarely she gets sharp shooting pain. At times she notices difficulty with reading and focusing. At times her right eye vision seems slightly blurred when she wakes up in the morning. She previously used to have some tightness in her head but that seems to have resolved. Very rarely she gets some numbness around the right lip. She has not seen an ophthalmologist for eye strain recently but  has seen several neuro ophthalmologist in the past during her visits at various academic centers. Currently she denies any headache or loss of vision double vision or vertigo focal extremity weakness or numbness. She remains on aspirin 81 which is tolerating well without bleeding or bruising. She has mild hypertension which is easily controlled on her medication and currently her blood pressure is in the 140 range mostly. She denies being under significant stress or having chronic anxiety issues  ROS:   14 system review of systems is positive for  palpitations, leg swelling, ringing in the ears, rash, itching, blurred vision, eye pain, snoring, feeling hot, numbness, anxiety, insomnia and all other systems negative  PMH:  Past Medical History  Diagnosis Date  . Hypertension   . Vision abnormalities     Social History:  Social History   Social History  . Marital Status: Married    Spouse Name: N/A  . Number of Children: N/A  . Years of Education: N/A   Occupational History  . Not on file.   Social History Main Topics  . Smoking status: Never Smoker   . Smokeless tobacco: Not on file  . Alcohol Use: Yes     Comment: casual drinker  . Drug Use: Not on file  . Sexual Activity: Not on file   Other Topics Concern  . Not on file   Social History Narrative  . No narrative on file    Medications:   No current outpatient prescriptions on file prior to  visit.   No current facility-administered medications on file prior to visit.    Allergies:   Allergies  Allergen Reactions  . Codeine Hives and Other (See Comments)    GI Upset Stomach ache  . Penicillins Hives, Swelling and Rash    Physical Exam General: well developed, well nourished middle-aged Caucasian lady, seated, in no evident distress. Slightly anxious. Head: head normocephalic and atraumatic.   Neck: supple with no carotid or supraclavicular bruits Cardiovascular: regular rate and rhythm, no  murmurs Musculoskeletal: no deformity Skin:  no rash/petichiae Vascular:  Normal pulses all extremities  Neurologic Exam Mental Status: Awake and fully alert. Oriented to place and time. Recent and remote memory intact. Attention span, concentration and fund of knowledge appropriate. Mood and affect appropriate.  Cranial Nerves: Fundoscopic exam reveals sharp disc margins. Pupils unequal right 3 mm and left 5 mm, briskly reactive to light. Very slight right eye ptosis. Extraocular movements full without nystagmus. Visual fields full to confrontation. Hearing intact. Facial sensation intact. Face, tongue, palate moves normally and symmetrically.  Motor: Normal bulk and tone. Normal strength in all tested extremity muscles. Sensory.: intact to touch , pinprick , position and vibratory sensation.  Coordination: Rapid alternating movements normal in all extremities. Finger-to-nose and heel-to-shin performed accurately bilaterally. Gait and Station: Arises from chair without difficulty. Stance is normal. Gait demonstrates normal stride length and balance . Able to heel, toe and tandem walk without difficulty.  Reflexes: 1+ and symmetric. Toes downgoing.       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 She has remained quite stable from neurovascular standpoint and has had multiple neurological opinions at [facility], [facility], [facility] and in office over the years.    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 She has no complaints of eye strain which is of unclear significance. I would recommend she see her ophthalmologist for that and I will make the referral. She was advised to continue aspirin 81 mg daily  lifelong and maintain strict control of hypertension.Avoid straining, lifting heavy weights and neck manipulation. Greater than 50% of time during this 45 minute consult for spent on counseling and coordination of care. She may return for follow-up in the future only as necessary and no routine follow-up appointment was made. Delia Heady, MD  Note: This document was prepared with digital dictation and possible smart phrase technology. Any transcriptional errors that result from this process are unintentional.

## 2015-10-01 ENCOUNTER — Ambulatory Visit (INDEPENDENT_AMBULATORY_CARE_PROVIDER_SITE_OTHER): Payer: No Typology Code available for payment source | Admitting: Neurology

## 2015-10-01 ENCOUNTER — Encounter: Payer: Self-pay | Admitting: Neurology

## 2015-10-01 ENCOUNTER — Telehealth: Payer: Self-pay | Admitting: Neurology

## 2015-10-01 ENCOUNTER — Encounter (INDEPENDENT_AMBULATORY_CARE_PROVIDER_SITE_OTHER): Payer: Self-pay

## 2015-10-01 VITALS — BP 130/82 | HR 68 | Ht 66.0 in | Wt 132.6 lb

## 2015-10-01 DIAGNOSIS — H53143 Visual discomfort, bilateral: Secondary | ICD-10-CM

## 2015-10-01 DIAGNOSIS — H5310 Unspecified subjective visual disturbances: Secondary | ICD-10-CM

## 2015-10-01 DIAGNOSIS — G909 Disorder of the autonomic nervous system, unspecified: Secondary | ICD-10-CM | POA: Diagnosis not present

## 2015-10-01 DIAGNOSIS — G902 Horner's syndrome: Secondary | ICD-10-CM

## 2015-10-01 MED ORDER — DIVALPROEX SODIUM ER 500 MG PO TB24: 500.0000 mg | ORAL_TABLET | Freq: Every day | ORAL | Status: DC

## 2015-10-01 NOTE — Telephone Encounter (Signed)
Patient is calling because she was seen this morning and an order for an x-ray was sent to [facility]. The patient states they are not in her insurance network. Please call and discuss. Thank you.

## 2015-10-01 NOTE — Patient Instructions (Signed)
Remember to drink plenty of fluid, eat healthy meals and do not skip any meals. Try to eat protein with a every meal and eat a healthy snack such as fruit or nuts in between meals. Try to keep a regular sleep-wake schedule and try to exercise daily, particularly in the form of walking, 20-30 minutes a day, if you can.   As far as your medications are concerned, I would like to suggest: Depakote 500mg  an hour before bedtime  As far as diagnostic testing: CXR  I would like to see you back in 3 months, sooner if we need to. Please call us with any interim questions, concerns, problems, updates or refill requests.   Please also call us for any test results so we can go over those with you on the phone.  My clinical assistant and will answer any of your questions and relay your messages to me and also relay most of my messages to you.   Our phone number is [phone]. We also have an after hours call service for urgent matters and there is a physician on-call for urgent questions. For any emergencies you know to call 911 or go to the nearest emergency room

## 2015-10-01 NOTE — Progress Notes (Signed)
[facility]    Provider:  Dr Lucia Gaskins Referring Provider: [name], MD Primary Care Physician:  Cala Bradford, MD  CC:  Eye strain  HPI:  Brenda Rubio is a 54 y.o. female here as a referral from Dr. 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 PMHx HTN and anxiety. Per notes from Dr. Pearlean Brownie in our office,  This was treated conservatively and she did quite well. She subsequently had follow-up MRI and MRA which showed interval recanalization of the dissection. She has continued to do well but has sought multiple opinions for dissection and Horner's syndrome and has been seen at [facility] as well as [facility] as well as more recently [facility] and no specific etiology of the dissection was found and she was recommended to stay on aspirin and conservative follow-up.   She is here today for follow up and opinion on her eye strain. Denies headache. She has a constant strain in her right eye > left eye. It does not give her a headache. She has a feeling of having to strain her eyes. Closing one eye helps. Color is less vibrant in the right eye, maybe because the pupil is smaller and less light gets in. No double vision. She has chronic right ptosis and smaller pupil. No headache. Every so often she has a sharp pain in the eyes. The strain is there even when closing both eyes. Closing one eye makes it better. Nothing else makes it better. Lights worsens her sensations. She has tried wearing a patch on the left eye and didn't work, even on the right eye it didn't work. She has dificulty with reading and focusing. No vertigo or dizziness. She has been diagnosed with migraines. Tried Depakote. She was evaluated by ophthalmology, Dr. Dione Booze.  Reviewed notes, labs and imaging from outside physicians, which showed:  Reviewed notes from a colleague neurologist Dr. Marlis Edelson appointment 2 months previous to this appoint ment in our office 08/07/2015:  HPI: Ms  Brenda Rubio is a53 year pleasant Caucasian lady who is referred back to me for follow-up for her remote right Horner's syndrome and carotid dissection. She was actually initially seen by me 12 years ago when she had some symptoms of head sensations in her headaches I unfortunately do not have those records but apparently at tried on Depakote which he took for a while and the symptoms went away and stopped. She was seen back in office in 2012 when 1 day she woke up with a headache and noticed right eye drooping and small pupillary size. Dr. Lynnae Prude I saw her and she was found to have a right carotid dissection but with preserved flow. This was treated conservatively and she did quite well. She subsequently had follow-up MRI and MRA which showed interval recanalization of the dissection. She has continued to do well but has sought multiple opinions for dissection and Horner's syndrome and has been seen at [facility] as well as [facility] as well as more recently [facility] and no specific etiology of the dissection was found and she was recommended to stay on aspirin and conservative follow-up. She denies any new focal neurovascular symptoms. Her main complaint is a feeling of eye strain more in the right eye and sometimes in the left as well. Very rarely she gets sharp shooting pain. At times she notices difficulty with reading and focusing. At times her right eye vision seems slightly blurred when she wakes up in the morning. She previously  used to have some tightness in her head but that seems to have resolved. Very rarely she gets some numbness around the right lip. She has not seen an ophthalmologist for eye strain recently but has seen several neuro ophthalmologist in the past during her visits at various academic centers. Currently she denies any headache or loss of vision double vision or vertigo focal extremity weakness or numbness. She remains on aspirin 81 which is tolerating well without bleeding or  bruising. She has mild hypertension which is easily controlled on her medication and currently her blood pressure is in the 140 range mostly. She denies being under significant stress or having chronic anxiety issues   Review of Systems: Patient complains of symptoms per HPI as well as the following symptoms: blurry vision, eye pain, palpitations, cough, ringing in ears, insomnia. Pertinent negatives per HPI. All others negative.   Social History   Social History  . Marital Status: Married    Spouse Name: Brenda Rubio  . Number of Children: 2  . Years of Education: 19   Occupational History  . [employer]    Social History Main Topics  . Smoking status: Never Smoker   . Smokeless tobacco: Not on file  . Alcohol Use: Yes     Comment: casual drinker  . Drug Use: Not on file  . Sexual Activity: Not on file   Other Topics Concern  . Not on file   Social History Narrative   Lives at home with husband and children   Caffeine use: Coffee/tea- drinks 2 cups per day    Family History  Problem Relation Age of Onset  . Stroke Mother   . Hypertension Mother   . Stroke Father   . Hypertension Father     Past Medical History  Diagnosis Date  . Hypertension   . Vision abnormalities     Past Surgical History  Procedure Laterality Date  . Cesarean section  November 2004    Current Outpatient Prescriptions  Medication Sig Dispense Refill  . ALPRAZolam (XANAX) 0.25 MG tablet TAKE 1/2 TO 1 TABLET BY MOUTH 3 TIMES A DAY AS NEEDED FOR ANXIETY  0  . aspirin EC 81 MG tablet Take by mouth.    . BYSTOLIC 10 MG tablet Take 10 mg by mouth daily.  5  . losartan (COZAAR) 100 MG tablet Take 100 mg by mouth daily.  5  . Multiple Vitamins tablet Take by mouth.    . polyvinyl alcohol (LIQUIFILM TEARS) 1.4 % ophthalmic solution Place 1 drop into both eyes as needed.    . triamcinolone cream (KENALOG) 0.1 % Apply topically.    Marland Kitchen zolpidem (AMBIEN) 5 MG tablet Take 5 mg by mouth as needed.  2   No current  facility-administered medications for this visit.    Allergies as of 10/01/2015 - Review Complete 10/01/2015  Allergen Reaction Noted  . Codeine Hives and Other (See Comments) 08/07/2015  . Penicillins Hives, Swelling, and Rash 08/07/2015    Vitals: BP 130/82 mmHg  Pulse 68  Ht  (1.676 m)  Wt 132 lb 9.6 oz (60.147 kg)  BMI 21.41 kg/m2 Last Weight:  Wt Readings from Last 1 Encounters:  10/01/15 132 lb 9.6 oz (60.147 kg)   Last Height:   Ht Readings from Last 1 Encounters:  10/01/15  (1.676 m)   Physical exam: Exam: Gen: NAD, conversant, well nourised, obese, well groomed  CV: RRR, no MRG. No Carotid Bruits. No peripheral edema, warm, nontender Eyes: Conjunctivae clear without exudates or hemorrhage  Neuro: Detailed Neurologic Exam  Speech:    Speech is normal; fluent and spontaneous with normal comprehension.  Cognition:    The patient is oriented to person, place, and time;     recent and remote memory intact;     language fluent;     normal attention, concentration,     fund of knowledge Cranial Nerves:    The  The fundi are unequal the right being smaller than the left c/w horner's syndrome, but reactivce to light.  normal and spontaneous venous pulsations are present. Visual fields are full to finger confrontation. Extraocular movements are intact. Trigeminal sensation is intact and the muscles of mastication are normal. Mild right ptosis otherwise the face is symmetric. The palate elevates in the midline. Hearing intact. Voice is normal. Shoulder shrug is normal. The tongue has normal motion without fasciculations.    Gait:    Heel-toe and tandem gait are normal.   Motor Observation:    No asymmetry, no atrophy, and no involuntary movements noted. Tone:    Normal muscle tone.    Posture:    Posture is normal. normal erect    Strength:    Strength is V/V in the upper and lower limbs.      Sensation: intact to LT     Toes:     The toes are downgoing bilaterally.   Clonus:    Clonus is absent.       Assessment/Plan:  54 year old with a remote history of internal carotid dissection and Horner's syndrome with resultant eye strain. Per notes from Dr. Pearlean BrownieSethi in our office,  This was treated conservatively and she did quite well. She subsequently had follow-up MRI and MRA which showed interval recanalization of the dissection. She has continued to do well but has sought multiple opinions for dissection and Horner's syndrome and [facility] well as [facility] and no specific etiology of the dissection was found and she was recommended to stay on aspirin and conservative follow-up.  Unclear etiology of her eye strain. She denies any headaches. Had a long discussion, can try to treat patient if this is an atypical type of headache. Will start with Depakote, this is also a good mood stabilizer as patient reports anxiety. i'm not sure this will help her but can try. Patient would like a chest xray to ensure no etiology of her horner's was overlooked such as a mediastinal mass. I tried to explain this would be highly unlikely at this point but ordered and was normal. She was advised to continue aspirin 81 mg daily lifelong and maintain strict control of hypertension, avoid straining, lifting heavy weights and neck manipulation. She can follow up with me closely and we can try best we can to help with her symptoms.  Naomie DeanAntonia Rogerio Boutelle, MD  [facility] [address] [place], [place] [id number]  Phone [phone] Fax [phone]  A total of 40 minutes was spent face-to-face with this patient. Over half this time was spent on counseling patient on the horner syndrome, eye strain diagnosis and different diagnostic and therapeutic options available.

## 2015-10-01 NOTE — Telephone Encounter (Signed)
LVM for pt to call back. Returning pt call. Please have her try calling [facility] at [phone]. Address: [address] Order was faxed over there. As long as she goes before 430pm, she can walk in and have it done.

## 2015-10-01 NOTE — Telephone Encounter (Signed)
Patient is returning a call. Per last notes I told the patient an order has been sent to [facility] for her x-ray. The patient stated [facility] is not a provider for her insurance but the patient said [facility] is in her network. I advised the patient per [name] that [facility] is the same as [facility]. The patient had some questions so I gave her [facility]'s telephone number [phone].

## 2015-10-02 LAB — COMPREHENSIVE METABOLIC PANEL
A/G RATIO: 1.8 (ref 1.1–2.5)
ALBUMIN: 4.6 g/dL (ref 3.5–5.5)
ALK PHOS: 66 IU/L (ref 39–117)
ALT: 14 IU/L (ref 0–32)
AST: 15 IU/L (ref 0–40)
BILIRUBIN TOTAL: 0.4 mg/dL (ref 0.0–1.2)
BUN / CREAT RATIO: 22 (ref 9–23)
BUN: 16 mg/dL (ref 6–24)
CHLORIDE: 100 mmol/L (ref 97–106)
CO2: 24 mmol/L (ref 18–29)
CREATININE: 0.74 mg/dL (ref 0.57–1.00)
Calcium: 9.9 mg/dL (ref 8.7–10.2)
GFR calc Af Amer: 106 mL/min/{1.73_m2} (ref >59)
GFR calc non Af Amer: 92 mL/min/{1.73_m2} (ref >59)
GLOBULIN, TOTAL: 2.6 g/dL (ref 1.5–4.5)
Glucose: 104 mg/dL — ABNORMAL HIGH (ref 65–99)
POTASSIUM: 5.6 mmol/L — AB (ref 3.5–5.2)
SODIUM: 141 mmol/L (ref 136–144)
Total Protein: 7.2 g/dL (ref 6.0–8.5)

## 2015-10-05 ENCOUNTER — Telehealth: Payer: Self-pay | Admitting: *Deleted

## 2015-10-05 NOTE — Telephone Encounter (Signed)
I have spoken with Olegario MessierKathy this afternoon and per AA, advised that labs had no sig. abnormalities, potassium is mildy elevated, but not concerning.  She verbalized understanding of same/fim

## 2015-10-05 NOTE — Telephone Encounter (Signed)
-----   Message from Anson FretAntonia B Ahern, MD sent at 10/05/2015  7:31 AM EDT ----- Let patient know labs were unremarkable. Mildly elevated potassium, not concerning.

## 2015-11-03 ENCOUNTER — Telehealth: Payer: Self-pay | Admitting: Neurology

## 2015-11-03 ENCOUNTER — Other Ambulatory Visit: Payer: Self-pay | Admitting: Neurology

## 2015-11-03 NOTE — Telephone Encounter (Signed)
LVM returning pt call. Gave [facility] phone number.  

## 2015-11-03 NOTE — Telephone Encounter (Signed)
Pt called sts divalproex (DEPAKOTE ER) 500 MG 24 hr tablet has offered no improvement. Please call and advise

## 2015-11-03 NOTE — Telephone Encounter (Signed)
Called pt. LVM for her to call back. Gave [facility] phone number and hours.

## 2015-11-03 NOTE — Telephone Encounter (Signed)
Called pt back. She took her last dose a couple night ago of depakote. She does not think its helping. She is still experiencing eye strain. Denies headache. Tried depakote in past for headaches and it had worked. Dr Lucia GaskinsAhern recommended another medication but she could not remember the name. She knows her liver enzymes would have to be checked if she were to start this other medication.    She also wanted to know if Dr Lucia GaskinsAhern received results from xray of her lungs. I called [facility] and they are faxing report to our office at [phone]. Told pt I will let her know when we received this. She verbalized understanding.   Received report- normal chest xray

## 2015-11-03 NOTE — Telephone Encounter (Signed)
Called pt back and relayed Dr Lucia GaskinsAhern message below. Pt would like to go ahead and try medication. Advised she can send to her pharmacy. Pt verbalized understanding. Advised that chest xray normal. Pt verbalized understanding.

## 2015-11-03 NOTE — Telephone Encounter (Signed)
We can try some nortriptyline at night. This is a good pain and headache medication. But honestly I don't really know why she is having eye strain or if there is anything I can offer her really. But we can try nortriptyline. Side effects include teratogenicity, do not Pregnant on this medication and use birth control. Serious side effects can include hypotension, hypertension, syncope, ventricular arrhythmias, QT prolongation and other cardiac side effects, stroke and seizures, ataxia tardive dyskinesias, extrapyramidal symptoms, increased intraocular pressure, leukopenia, thrombocytopenia, hallucinations, suicidality and other serious side effects. Common reactions include drowsiness, dry mouth, dizziness, constipation, blurred vision, palpitations, tachycardia, impaired coordination, increased appetite, nausea vomiting, weakness, confusion, disorientation, restlessness, anxiety and other side effects.

## 2015-11-03 NOTE — Telephone Encounter (Signed)
Pt returned [name]'s call °

## 2015-11-04 ENCOUNTER — Telehealth: Payer: Self-pay | Admitting: Neurology

## 2015-11-04 ENCOUNTER — Other Ambulatory Visit: Payer: Self-pay | Admitting: Neurology

## 2015-11-04 DIAGNOSIS — H5713 Ocular pain, bilateral: Secondary | ICD-10-CM

## 2015-11-04 MED ORDER — NORTRIPTYLINE HCL 25 MG PO CAPS: 25.0000 mg | ORAL_CAPSULE | Freq: Every day | ORAL | Status: DC

## 2015-11-04 NOTE — Telephone Encounter (Signed)
Brenda MeadEmma, Please let patient know that her chest xray was normal, thanks.

## 2015-11-04 NOTE — Telephone Encounter (Signed)
[name], let patient know her CXR was normal thanks

## 2015-11-05 NOTE — Telephone Encounter (Signed)
Spoke w/ pt about normal chest xray 11/29

## 2015-11-05 NOTE — Telephone Encounter (Signed)
Spoke w/ pt about normal chest xray normal on 11/29

## 2015-11-05 NOTE — Telephone Encounter (Signed)
Did I send this to you? thanks

## 2015-12-30 ENCOUNTER — Encounter: Payer: Self-pay | Admitting: Neurology

## 2015-12-30 ENCOUNTER — Ambulatory Visit (INDEPENDENT_AMBULATORY_CARE_PROVIDER_SITE_OTHER): Payer: No Typology Code available for payment source | Admitting: Neurology

## 2015-12-30 VITALS — BP 140/82 | HR 74 | Ht 66.0 in | Wt 137.2 lb

## 2015-12-30 DIAGNOSIS — G902 Horner's syndrome: Secondary | ICD-10-CM | POA: Insufficient documentation

## 2015-12-30 DIAGNOSIS — H53143 Visual discomfort, bilateral: Secondary | ICD-10-CM | POA: Diagnosis not present

## 2015-12-30 DIAGNOSIS — H5310 Unspecified subjective visual disturbances: Secondary | ICD-10-CM | POA: Insufficient documentation

## 2015-12-30 DIAGNOSIS — G909 Disorder of the autonomic nervous system, unspecified: Secondary | ICD-10-CM

## 2015-12-30 MED ORDER — PREGABALIN 75 MG PO CAPS: 75.0000 mg | ORAL_CAPSULE | Freq: Two times a day (BID) | ORAL | Status: AC

## 2015-12-30 NOTE — Patient Instructions (Signed)
Remember to drink plenty of fluid, eat healthy meals and do not skip any meals. Try to eat protein with a every meal and eat a healthy snack such as fruit or nuts in between meals. Try to keep a regular sleep-wake schedule and try to exercise daily, particularly in the form of walking, 20-30 minutes a day, if you can.   As far as your medications are concerned, I would like to suggest: Lyrica  twice daily  I would like to see you back as needed, sooner if we need to. Please call us with any interim questions, concerns, problems, updates or refill requests.   Our phone number is [phone]. We also have an after hours call service for urgent matters and there is a physician on-call for urgent questions. For any emergencies you know to call 911 or go to the nearest emergency room

## 2015-12-30 NOTE — Progress Notes (Signed)
[facility]    Provider:  Dr Lucia Gaskins Referring Provider: [name], MD Primary Care Physician:  Cala Bradford, MD  CC: Eye strain  Interval update 12/30/2015:  She has eye strain since horner syndrome. Not sure that there is anymore I can do to try to help her.  I did not think any headache medications would work, patient ewanted to try some. We have tried Depakote. Tried TCAs. Can try Lyrica but I don't think it will help and this may be something she has to deal with as a chronic condition. Discussed with patient.  HPI: Brenda Rubio is a 55 y.o. female here as a referral from Dr. 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 PMHx HTN and anxiety. Per notes from Dr. Pearlean Brownie in our office, This was treated conservatively and she did quite well. She subsequently had follow-up MRI and MRA which showed interval recanalization of the dissection. She has continued to do well but has sought multiple opinions for dissection and Horner's syndrome and has been seen at [facility] as well as [facility] as well as more recently [facility] and no specific etiology of the dissection was found and she was recommended to stay on aspirin and conservative follow-up.   She is here today for follow up and opinion on her eye strain. Denies headache. She has a constant strain in her right eye > left eye. It does not give her a headache. She has a feeling of having to strain her eyes. Closing one eye helps. Color is less vibrant in the right eye, maybe because the pupil is smaller and less light gets in. No double vision. She has chronic right ptosis and smaller pupil. No headache. Every so often she has a sharp pain in the eyes. The strain is there even when closing both eyes. Closing one eye makes it better. Nothing else makes it better. Lights worsens her sensations. She has tried wearing a patch on the left eye and didn't work, even on the right eye it didn't work. She has  dificulty with reading and focusing. No vertigo or dizziness. She has been diagnosed with migraines. Tried Depakote. She was evaluated by ophthalmology, Dr. Dione Booze.  Reviewed notes, labs and imaging from outside physicians, which showed:  Reviewed notes from a colleague neurologist Dr. 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 She was actually initially seen by me 12 years ago when she had some symptoms of head sensations in her headaches I unfortunately do not have those records but apparently at tried on Depakote which he took for a while and the symptoms went away and stopped. She was seen back in office in 2012 when 1 day she woke up with a headache and noticed right eye drooping and small pupillary size. Dr. Lynnae Prude I saw her and she was found to have a right carotid dissection but with preserved flow. This was treated conservatively and she did quite well. She subsequently had follow-up MRI and MRA which showed interval recanalization of the dissection. She has continued to do well but has sought multiple opinions for dissection and Horner's syndrome and has been seen at [facility] as well as [facility] as well as more recently [facility] and no specific etiology of the dissection was found and she  was recommended to stay on aspirin and conservative follow-up. She denies any new focal neurovascular symptoms. Her main complaint is a feeling of eye strain more in the right eye and sometimes in the left as well. Very rarely she gets sharp shooting pain. At times she notices difficulty with reading and focusing. At times her right eye vision seems slightly blurred when she wakes up in the morning. She previously used to have some tightness in her head but that seems to have resolved. Very rarely she gets some  numbness around the right lip. She has not seen an ophthalmologist for eye strain recently but has seen several neuro ophthalmologist in the past during her visits at various academic centers. Currently she denies any headache or loss of vision double vision or vertigo focal extremity weakness or numbness. She remains on aspirin 81 which is tolerating well without bleeding or bruising. She has mild hypertension which is easily controlled on her medication and currently her blood pressure is in the 140 range mostly. She denies being under significant stress or having chronic anxiety issues   Review of Systems: Patient complains of symptoms per HPI as well as the following symptoms: blurry vision, eye pain, palpitations, cough, ringing in ears, insomnia. Pertinent negatives per HPI. All others negative.   Social History   Social History  . Marital Status: Married    Spouse Name: Thayer Ohm  . Number of Children: 2  . Years of Education: 19   Occupational History  . [employer]    Social History Main Topics  . Smoking status: Never Smoker   . Smokeless tobacco: Not on file  . Alcohol Use: Yes     Comment: casual drinker  . Drug Use: Not on file  . Sexual Activity: Not on file   Other Topics Concern  . Not on file   Social History Narrative   Lives at home with husband and children   Caffeine use: Coffee/tea- drinks 2 cups per day    Family History  Problem Relation Age of Onset  . Stroke Mother   . Hypertension Mother   . Stroke Father   . Hypertension Father     Past Medical History  Diagnosis Date  . Hypertension   . Vision abnormalities     Past Surgical History  Procedure Laterality Date  . Cesarean section  November 2004    Current Outpatient Prescriptions  Medication Sig Dispense Refill  . ALPRAZolam (XANAX) 0.25 MG tablet TAKE 1/2 TO 1 TABLET BY MOUTH 3 TIMES A DAY AS NEEDED FOR ANXIETY  0  . aspirin EC 81 MG tablet Take by mouth.    . BYSTOLIC 10 MG tablet Take 10 mg  by mouth daily.  5  . divalproex (DEPAKOTE ER) 500 MG 24 hr tablet Take 1 tablet (500 mg total) by mouth daily. 30 tablet 6  . losartan (COZAAR) 100 MG tablet Take 100 mg by mouth daily.  5  . Multiple Vitamins tablet Take by mouth.    . nortriptyline (PAMELOR) 25 MG capsule Take 1 capsule (25 mg total) by mouth at bedtime. 30 capsule 3  . polyvinyl alcohol (LIQUIFILM TEARS) 1.4 % ophthalmic solution Place 1 drop into both eyes as needed.    . triamcinolone cream (KENALOG) 0.1 % Apply topically.    Marland Kitchen zolpidem (AMBIEN) 5 MG tablet Take 5 mg by mouth as needed.  2   No current facility-administered medications for this visit.    Allergies as of 12/30/2015 - Review Complete  10/01/2015  Allergen Reaction Noted  . Codeine Hives and Other (See Comments) 08/07/2015  . Penicillins Hives, Swelling, and Rash 08/07/2015    Vitals: BP 140/82 mmHg  Pulse 74  Wt 137 lb 3.2 oz (62.234 kg)  SpO2 94% Last Weight:  Wt Readings from Last 1 Encounters:  12/30/15 137 lb 3.2 oz (62.234 kg)   Last Height:   Ht Readings from Last 1 Encounters:  10/01/15  (1.676 m)       Speech:  Speech is normal; fluent and spontaneous with normal comprehension.  Cognition:  The patient is oriented to person, place, and time;   recent and remote memory intact;   language fluent;   normal attention, concentration,   fund of knowledge Cranial Nerves:  The The fundi are unequal the right being smaller than the left c/w horner's syndrome, but reactivce to light. normal and spontaneous venous pulsations are present. Visual fields are full to finger confrontation. Extraocular movements are intact. Trigeminal sensation is intact and the muscles of mastication are normal. Mild right ptosis otherwise the face is symmetric. The palate elevates in the midline. Hearing intact. Voice is normal. Shoulder shrug is normal. The tongue has normal motion without fasciculations.    Gait:  Heel-toe and  tandem gait are normal.   Motor Observation:  No asymmetry, no atrophy, and no involuntary movements noted. Tone:  Normal muscle tone.   Posture:  Posture is normal. normal erect   Strength:  Strength is V/V in the upper and lower limbs.    Sensation: intact to LT   Toes:  The toes are downgoing bilaterally.  Clonus:  Clonus is absent.      Assessment/Plan: 55 year old with a remote history of internal carotid dissection and Horner's syndrome with resultant eye strain. Per notes from Dr. Pearlean Brownie in our office, This was treated conservatively and she did quite well. She subsequently had follow-up MRI and MRA which showed interval recanalization of the dissection. She has continued to do well but has sought multiple opinions for dissection and Horner's syndrome and has been seen at [facility] as well as [facility] as well as more recently [facility] and no specific etiology of the dissection was found and she was recommended to stay on aspirin and conservative follow-up.   Unclear etiology of her eye strain. She denies any headaches. Had a long discussion, Not sure that there is anymore I can do to try to help her.  I did not think any headache medications would work, patient ewanted to try some. We have tried Depakote. Tried TCAs. Can try Lyrica but I don't think it will help and this may be something she has to deal with as a chronic condition. Discussed with patient.   She was advised to continue aspirin 81 mg daily lifelong and maintain strict control of hypertension, avoid straining, lifting heavy weights and neck manipulation.   Naomie Dean, MD  [facility] [address] [place], [place] [address]  Phone [phone] Fax [phone]   A total of 30 minutes was spent face-to-face with this patient. Over half this time was spent on counseling patient on the Horner Syndrome and eye strain diagnosis and different  diagnostic and therapeutic options available.

## 2017-05-29 ENCOUNTER — Ambulatory Visit: Payer: No Typology Code available for payment source | Admitting: Family Medicine

## 2018-04-25 ENCOUNTER — Ambulatory Visit: Payer: No Typology Code available for payment source | Admitting: Neurology

## 2018-10-15 ENCOUNTER — Ambulatory Visit (INDEPENDENT_AMBULATORY_CARE_PROVIDER_SITE_OTHER): Payer: No Typology Code available for payment source | Admitting: Neurology

## 2018-10-15 ENCOUNTER — Encounter: Payer: Self-pay | Admitting: Neurology

## 2018-10-15 ENCOUNTER — Encounter

## 2018-10-15 VITALS — BP 126/92 | HR 76 | Ht 66.0 in | Wt 174.0 lb

## 2018-10-15 DIAGNOSIS — M25569 Pain in unspecified knee: Secondary | ICD-10-CM

## 2018-10-15 DIAGNOSIS — H11421 Conjunctival edema, right eye: Secondary | ICD-10-CM

## 2018-10-15 DIAGNOSIS — G902 Horner's syndrome: Secondary | ICD-10-CM

## 2018-10-15 DIAGNOSIS — I7771 Dissection of carotid artery: Secondary | ICD-10-CM

## 2018-10-15 DIAGNOSIS — H5711 Ocular pain, right eye: Secondary | ICD-10-CM

## 2018-10-15 DIAGNOSIS — I671 Cerebral aneurysm, nonruptured: Secondary | ICD-10-CM

## 2018-10-15 DIAGNOSIS — H0520 Unspecified exophthalmos: Secondary | ICD-10-CM | POA: Diagnosis not present

## 2018-10-15 MED ORDER — ACETAZOLAMIDE 250 MG PO TABS: 250.0000 mg | ORAL_TABLET | Freq: Two times a day (BID) | ORAL | 11 refills | Status: AC

## 2018-10-15 NOTE — Patient Instructions (Addendum)
Labwork today CTA of the head and neck (May consider IR angiogram ) Start Diamox Referral to optho  Acetazolamide tablets What is this medicine? ACETAZOLAMIDE (a set a ZOLE a mide) is used to treat glaucoma and some seizure disorders and headache disorders. It may be used to treat edema or swelling from heart failure or from other medicines. This medicine is also used to treat and to prevent altitude or mountain sickness. This medicine may be used for other purposes; ask your health care provider or pharmacist if you have questions. COMMON BRAND NAME(S): Diamox What should I tell my health care provider before I take this medicine? They need to know if you have any of these conditions: -diabetes -kidney disease -liver disease -lung disease -an unusual or allergic reaction to acetazolamide, sulfa drugs, other medicines, foods, dyes, or preservatives -pregnant or trying to get pregnant -breast-feeding How should I use this medicine? Take this medicine by mouth with a glass of water. Follow the directions on the prescription label. Take this medicine with food if it upsets your stomach. Take your doses at regular intervals. Do not take your medicine more often than directed. Do not stop taking except on your doctor's advice. Talk to your pediatrician regarding the use of this medicine in children. Special care may be needed. Patients over 61 years old may have a stronger reaction and need a smaller dose. Overdosage: If you think you have taken too much of this medicine contact a poison control center or emergency room at once. NOTE: This medicine is only for you. Do not share this medicine with others. What if I miss a dose? If you miss a dose, take it as soon as you can. If it is almost time for your next dose, take only that dose. Do not take double or extra doses. What may interact with this medicine? Do not take this medicine with any of the following medications: -methazolamide This  medicine may also interact with the following medications: -aspirin and aspirin-like medicines -cyclosporine -lithium -medicine for diabetes -methenamine -other diuretics -phenytoin -primidone -quinidine -sodium bicarbonate -stimulant medicines like dextroamphetamine This list may not describe all possible interactions. Give your health care provider a list of all the medicines, herbs, non-prescription drugs, or dietary supplements you use. Also tell them if you smoke, drink alcohol, or use illegal drugs. Some items may interact with your medicine. What should I watch for while using this medicine? Visit your doctor or health care professional for regular checks on your progress. You will need blood work done regularly. If you are diabetic, check your blood sugar as directed. You may need to be on a special diet while taking this medicine. Ask your doctor. Also, ask how many glasses of fluid you need to drink a day. You must not get dehydrated. You may get drowsy or dizzy. Do not drive, use machinery, or do anything that needs mental alertness until you know how this medicine affects you. Do not stand or sit up quickly, especially if you are an older patient. This reduces the risk of dizzy or fainting spells. This medicine can make you more sensitive to the sun. Keep out of the sun. If you cannot avoid being in the sun, wear protective clothing and use sunscreen. Do not use sun lamps or tanning beds/booths. What side effects may I notice from receiving this medicine? Side effects that you should report to your doctor or health care professional as soon as possible: -allergic reactions like skin rash, itching  or hives, swelling of the face, lips, or tongue -breathing problems -confusion, depression -dark urine -fever -numbness, tingling in hands or feet -redness, blistering, peeling or loosening of the skin, including inside the mouth -ringing in the ears -seizures -unusually weak or  tired -yellowing of the eyes or skin Side effects that usually do not require medical attention (report to your doctor or health care professional if they continue or are bothersome): -change in taste -diarrhea -headache -loss of appetite -nausea, vomiting -passing urine more often This list may not describe all possible side effects. Call your doctor for medical advice about side effects. You may report side effects to FDA at 1-800-FDA-1088. Where should I keep my medicine? Keep out of the reach of children. Store at room temperature between 20 and 25 degrees C (68 and 77 degrees F). Throw away any unused medicine after the expiration date. NOTE: This sheet is a summary. It may not cover all possible information. If you have questions about this medicine, talk to your doctor, pharmacist, or health care provider.  2018 Elsevier/Gold Standard (2008-02-13 10:59:40)

## 2018-10-15 NOTE — Progress Notes (Signed)
[facility]    Provider:  Dr Lucia Gaskins Referring Provider: [name], MD Primary Care Physician:  [name], MD  CC: Eye strain, pain behind the eyes  HPI: Brenda Rubio is a 57 y.o. female here as a referral from Dr. 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 PMHx HTN and anxiety. Per notes from Dr. Pearlean Brownie in our office, This was treated conservatively and she did quite well. She subsequently had follow-up MRI and MRA which showed interval recanalization of the dissection. She has continued to do well but has sought multiple opinions for dissection and Horner's syndrome and has been seen at [facility] as well as [facility] as well as more recently [facility] and no specific etiology of the dissection was found and she was recommended to stay on aspirin and conservative follow-up. Since this time she reports pressure behind the eyes and has right ptosis and proptosis.   Interval history 10/15/2018: She is here today for follow up on her eye pressure. Denies headache but more pressure behind the eyes right > left and does have proptosis of the right eye. . She has a constant strain/pressure in her right eye > left eye. It does not give her a headache. She has a feeling of having to strain her eyes. Closing one eye helps. Color is less vibrant in the right eye, maybe because the pupil is smaller and less light gets in. No double vision. She has chronic right ptosis and smaller pupil. Every so often she has a sharp pain in the eyes. The strain is there even when closing both eyes. Closing one eye makes it better. Nothing else makes it better. Lights worsens her sensations. She has tried wearing a patch on the left eye and didn't work, even on the right eye it didn't work. She has dificulty with reading and focusing. No vertigo or dizziness. She has been diagnosed with migraines. Tried Depakote. She was evaluated by ophthalmology, Dr. Dione Booze. We tried  multiple anti-epilepsy medications.  Interval update 12/30/2015:  She has eye strain since horner syndrome. Not sure that there is anymore I can do to try to help her.  I did not think any headache medications would work, patient ewanted to try some. We have tried Depakote. Tried TCAs. Can try Lyrica but I don't think it will help and this may be something she has to deal with as a chronic condition. Discussed with patient.  Reviewed notes, labs and imaging from outside physicians, which showed:  Reviewed notes from a colleague neurologist Dr. Marlis Edelson appointment 2 months previous to this appoint ment in our office 08/07/2015:  HPI: Ms Brophy is a53 year pleasant Caucasian lady who is referred back to me for follow-up for her remote right Horner's syndrome and carotid dissection. She was actually initially seen by me 12 years ago when she had some symptoms of head sensations in her headaches I unfortunately do not have those records but apparently at tried on Depakote which he took for a while and the symptoms went away and stopped. She was seen back in office in 2012 when 1 day she woke up with a headache and noticed right eye drooping and small pupillary size. Dr. Lynnae Prude I saw her and she was found to have a right carotid dissection but with preserved flow. This was treated conservatively and she did quite well. She subsequently had follow-up MRI and MRA which showed interval recanalization of the dissection. She has continued to  do well but has sought multiple opinions for dissection and Horner's syndrome and has been seen at [facility] as well as [facility] as well as more recently [facility] and no specific etiology of the dissection was found and she was recommended to stay on aspirin and conservative follow-up. She denies any new focal neurovascular symptoms. Her main complaint is a feeling of eye strain more in the right eye and sometimes in the left as well. Very rarely she gets sharp shooting  pain. At times she notices difficulty with reading and focusing. At times her right eye vision seems slightly blurred when she wakes up in the morning. She previously used to have some tightness in her head but that seems to have resolved. Very rarely she gets some numbness around the right lip. She has not seen an ophthalmologist for eye strain recently but has seen several neuro ophthalmologist in the past during her visits at various academic centers. Currently she denies any headache or loss of vision double vision or vertigo focal extremity weakness or numbness. She remains on aspirin 81 which is tolerating well without bleeding or bruising. She has mild hypertension which is easily controlled on her medication and currently her blood pressure is in the 140 range mostly. She denies being under significant stress or having chronic anxiety issues   Review of Systems: Patient complains of symptoms per HPI as well as the following symptoms: blurry vision, eye pain, palpitations, cough, ringing in ears, insomnia. Pertinent negatives per HPI. All others negative.   Social History   Socioeconomic History  . Marital status: Legally Separated    Spouse name: Thayer Ohm  . Number of children: 2  . Years of education: 55  . Highest education level: Not on file  Occupational History  . Occupation: [employer]  Social Needs  . Financial resource strain: Not on file  . Food insecurity:    Worry: Not on file    Inability: Not on file  . Transportation needs:    Medical: Not on file    Non-medical: Not on file  Tobacco Use  . Smoking status: Never Smoker  . Smokeless tobacco: Never Used  Substance and Sexual Activity  . Alcohol use: Yes    Comment: casual drinker  . Drug use: Not Currently    Comment: marijuana in past 30 years ago  . Sexual activity: Not on file  Lifestyle  . Physical activity:    Days per week: Not on file    Minutes per session: Not on file  . Stress: Not on file  Relationships  .  Social connections:    Talks on phone: Not on file    Gets together: Not on file    Attends religious service: Not on file    Active member of club or organization: Not on file    Attends meetings of clubs or organizations: Not on file    Relationship status: Not on file  . Intimate partner violence:    Fear of current or ex partner: Not on file    Emotionally abused: Not on file    Physically abused: Not on file    Forced sexual activity: Not on file  Other Topics Concern  . Not on file  Social History Narrative   Lives at home with children   Caffeine use: mostly tea- drinks 2 cups per day, coffee maybe twice a week   Right handed    Family History  Problem Relation Age of Onset  . Stroke  Mother   . Hypertension Mother   . Stroke Father   . Hypertension Father     Past Medical History:  Diagnosis Date  . Hypertension   . Vision abnormalities     Past Surgical History:  Procedure Laterality Date  . CESAREAN SECTION  November 2004    Current Outpatient Medications  Medication Sig Dispense Refill  . ALPRAZolam (XANAX) 0.5 MG tablet Take 0.5-1 tablets by mouth daily as needed.  0  . aspirin EC 81 MG tablet Take by mouth.    . BYSTOLIC 10 MG tablet Take 10 mg by mouth daily.  5  . hydrochlorothiazide (HYDRODIURIL) 12.5 MG tablet Take 12.5 mg by mouth daily.  1  . losartan (COZAAR) 100 MG tablet Take 100 mg by mouth daily.  5  . Multiple Vitamins tablet Take by mouth.    . triamcinolone cream (KENALOG) 0.1 % Apply topically.    . venlafaxine XR (EFFEXOR-XR) 75 MG 24 hr capsule Take 75 mg by mouth daily.  5  . zolpidem (AMBIEN) 5 MG tablet Take 5 mg by mouth as needed.  2  . pregabalin (LYRICA) 75 MG capsule Take 1 capsule (75 mg total) by mouth 2 (two) times daily. (Patient not taking: Reported on 10/15/2018) 60 capsule 11   No current facility-administered medications for this visit.     Allergies as of 10/15/2018 - Review Complete 10/15/2018  Allergen Reaction  Noted  . Codeine Hives and Other (See Comments) 08/07/2015  . Penicillins Hives, Swelling, and Rash 08/07/2015    Vitals: BP (!) 126/92 (BP Location: Right Arm, Patient Position: Sitting)   Pulse 76   Ht 5\' 6"  (1.676 m)   Wt 174 lb (78.9 kg)   BMI 28.08 kg/m  Last Weight:  Wt Readings from Last 1 Encounters:  10/15/18 174 lb (78.9 kg)   Last Height:   Ht Readings from Last 1 Encounters:  10/15/18 5\' 6"  (1.676 m)       Speech:  Speech is normal; fluent and spontaneous with normal comprehension.  Cognition:  The patient is oriented to person, place, and time;   recent and remote memory intact;   language fluent;   normal attention, concentration,   fund of knowledge Cranial Nerves:  The The fundi are unequal the right being smaller than the left c/w horner's syndrome, but reactivce to light. normal and spontaneous venous pulsations are present. Visual fields are full to finger confrontation. Extraocular movements are intact. Trigeminal sensation is intact and the muscles of mastication are normal. Right proptosis and  right ptosis otherwise the face is symmetric. The palate elevates in the midline. Hearing intact. Voice is normal. Shoulder shrug is normal. The tongue has normal motion without fasciculations.    Gait:  Heel-toe and tandem gait are normal.   Motor Observation:  No asymmetry, no atrophy, and no involuntary movements noted. Tone:  Normal muscle tone.   Posture:  Posture is normal. normal erect   Strength:  Strength is V/V in the upper and lower limbs.    Sensation: intact to LT   Toes:  The toes are downgoing bilaterally.  Clonus:  Clonus is absent.      Assessment/Plan: 57 year old with a remote history of internal carotid dissection and Horner's syndrome with resultant eye strain, pressure behind her right eye. Per notes from Dr. Pearlean Brownie in our office, This was treated conservatively and she did  quite well. She subsequently had follow-up MRI and MRA which showed interval recanalization of  the dissection. She has continued to do well but has sought multiple opinions for dissection and Horner's syndrome and has been seen at [facility] as well as [facility] as well as more recently [facility] and no specific etiology of the dissection was found and she was recommended to stay on aspirin and conservative follow-up.  She continues to have eye pain right > left. But denies headache. We have tried Depakote. Tried TCAs and Lyrica in the past. Unclear etiology of left eye fullness, pain, injection. Should rule out cavernous AV-Fistula with CTA and possibly IR angiogram.   CTA of the head and neck  Labs Try diamox to see if this helps with the pressure feelings behind the eye Referral for her to see [facility] as a follow up   She was advised to continue aspirin 81 mg daily lifelong and maintain strict control of hypertension, avoid straining, lifting heavy weights and neck manipulation.   Orders Placed This Encounter  Procedures  . CT ANGIO HEAD W OR WO CONTRAST  . CT ANGIO NECK W OR WO CONTRAST  . Thyroid Panel With TSH  . Thyroglobulin antibody  . Thyroid peroxidase antibody  . ANA, IFA (with reflex)  . Sjogren's syndrome antibods(ssa + ssb)  . Basic Metabolic Panel  . Rheumatoid factor  . CYCLIC CITRUL PEPTIDE ANTIBODY, IGG/IGA  . B. burgdorfi Antibody  . Ambulatory referral to Ophthalmology   Meds ordered this encounter  Medications  . acetaZOLAMIDE (DIAMOX) 250 MG tablet    Sig: Take 1 tablet (250 mg total) by mouth 2 (two) times daily.    Dispense:  60 tablet    Refill:  11     Naomie Dean, MD  [facility] [address] [place], [place] [address]  Phone [phone] Fax [phone]   A total of 40 minutes was spent face-to-face with this patient. Over half this time was spent on counseling patient on the  1. Eye pain, right   2.  Pain of right eye   3. Proptosis   4. Arthralgia of knee, unspecified laterality   5. Dissection of carotid artery (HCC)   6. Chemosis of right conjunctiva   7. Carotid artery-cavernous sinus fistula   8. Horner syndrome     diagnosis and different diagnostic and therapeutic options available.

## 2018-10-17 LAB — THYROID PANEL WITH TSH
FREE THYROXINE INDEX: 1.8 (ref 1.2–4.9)
T3 Uptake Ratio: 25 % (ref 24–39)
T4, Total: 7 ug/dL (ref 4.5–12.0)
TSH: 1.19 u[IU]/mL (ref 0.450–4.500)

## 2018-10-17 LAB — RHEUMATOID FACTOR: Rhuematoid fact SerPl-aCnc: <10 IU/mL (ref 0.0–13.9)

## 2018-10-17 LAB — BASIC METABOLIC PANEL
BUN / CREAT RATIO: 23 (ref 9–23)
BUN: 19 mg/dL (ref 6–24)
CO2: 22 mmol/L (ref 20–29)
Calcium: 9.3 mg/dL (ref 8.7–10.2)
Chloride: 98 mmol/L (ref 96–106)
Creatinine, Ser: 0.84 mg/dL (ref 0.57–1.00)
GFR calc non Af Amer: 77 mL/min/{1.73_m2} (ref >59)
GFR, EST AFRICAN AMERICAN: 89 mL/min/{1.73_m2} (ref >59)
Glucose: 113 mg/dL — ABNORMAL HIGH (ref 65–99)
POTASSIUM: 4.5 mmol/L (ref 3.5–5.2)
SODIUM: 138 mmol/L (ref 134–144)

## 2018-10-17 LAB — ANTINUCLEAR ANTIBODIES, IFA: ANA Titer 1: NEGATIVE

## 2018-10-17 LAB — SJOGREN'S SYNDROME ANTIBODS(SSA + SSB)

## 2018-10-17 LAB — B. BURGDORFI ANTIBODIES: Lyme IgG/IgM Ab: <0.91 {ISR} (ref 0.00–0.90)

## 2018-10-17 LAB — THYROGLOBULIN ANTIBODY

## 2018-10-17 LAB — CYCLIC CITRUL PEPTIDE ANTIBODY, IGG/IGA: CYCLIC CITRULLIN PEPTIDE AB: 6 U (ref 0–19)

## 2018-10-17 LAB — THYROID PEROXIDASE ANTIBODY: THYROID PEROXIDASE ANTIBODY: 11 [IU]/mL (ref 0–34)

## 2018-10-18 ENCOUNTER — Telehealth: Payer: Self-pay | Admitting: Neurology

## 2018-10-18 NOTE — Telephone Encounter (Signed)
PHCS Auth: NPR faxed order to [place] w/one [employer] insurance and they reach out to the pt to schedule

## 2018-10-18 NOTE — Telephone Encounter (Signed)
-----   Message from Anson FretAntonia B Ahern, MD sent at 10/17/2018  7:49 PM EST ----- Labs all normal thanks

## 2018-10-18 NOTE — Telephone Encounter (Signed)
Called and informed the pt all lab work was WNL and Dr Lucia GaskinsAhern had no concerns. Pt verbalized understanding. Pt had no questions at this time but was encouraged to call back if questions arise.

## 2018-10-24 NOTE — Telephone Encounter (Signed)
Evonie with one [employer] called and stated they have tried to reach out to the patient 5 times and now they are going to close out the case. If the patient calls back to give the patient one call phone number of [phone] and they will schedule the CT with the patient.

## 2018-11-21 NOTE — Telephone Encounter (Signed)
Patient called in and stated that [facility] called her to schedule her CT's I called [facility] to make sure they had both orders they did. But since she has one call they have to give them a call to schedule.. I spoke to one call to make sure they still had her information they stated they do and they have tried to reach out to the pt.Brenda Rubio. I told Thayer OhmChris at one call I would tell her to give them a call. I spoke to the patient and she will give one call a call so [facility]. I gave her one call number of [phone]

## 2018-11-26 ENCOUNTER — Encounter: Payer: Self-pay | Admitting: Neurology

## 2018-12-12 ENCOUNTER — Telehealth: Payer: Self-pay | Admitting: Neurology

## 2018-12-12 NOTE — Telephone Encounter (Signed)
Please let patient know the CTAngio of her head/neck was normal thanks

## 2018-12-13 NOTE — Telephone Encounter (Signed)
I called the patient on [phone] and LVM (ok per DPR) informing patient that her CT Angio of her head and neck are both normal. Left office number call back if she has any questions and informed her that a call back is not required.

## 2019-01-22 ENCOUNTER — Ambulatory Visit: Payer: No Typology Code available for payment source | Admitting: Neurology

## 2020-02-21 ENCOUNTER — Encounter: Payer: Self-pay | Admitting: Cardiology

## 2020-03-09 ENCOUNTER — Encounter: Payer: Self-pay | Admitting: Family Medicine

## 2020-04-13 ENCOUNTER — Ambulatory Visit (INDEPENDENT_AMBULATORY_CARE_PROVIDER_SITE_OTHER): Payer: No Typology Code available for payment source | Admitting: Interventional Cardiology

## 2020-04-13 ENCOUNTER — Encounter: Payer: Self-pay | Admitting: Interventional Cardiology

## 2020-04-13 ENCOUNTER — Other Ambulatory Visit: Payer: Self-pay

## 2020-04-13 VITALS — BP 122/84 | HR 76 | Ht 66.0 in | Wt 195.2 lb

## 2020-04-13 DIAGNOSIS — I10 Essential (primary) hypertension: Secondary | ICD-10-CM | POA: Diagnosis not present

## 2020-04-13 DIAGNOSIS — R7303 Prediabetes: Secondary | ICD-10-CM | POA: Diagnosis not present

## 2020-04-13 DIAGNOSIS — R072 Precordial pain: Secondary | ICD-10-CM

## 2020-04-13 MED ORDER — METOPROLOL TARTRATE 50 MG PO TABS
ORAL_TABLET | ORAL | 0 refills | Status: AC
Start: 2020-04-13 — End: ?

## 2020-04-13 NOTE — Progress Notes (Signed)
Cardiology Office Note   Date:  04/13/2020   ID:  Brenda Rubio, DOB Mar 12, 1961, MRN [id number]  PCP:  [name], MD    No chief complaint on file.  Chest pain  Wt Readings from Last 3 Encounters:  04/13/20 195 lb 3.2 oz (88.5 kg)  10/15/18 174 lb (78.9 kg)  12/30/15 137 lb 3.2 oz (62.2 kg)       History of Present Illness: Brenda Rubio is a 59 y.o. female who is being seen today for the evaluation of chest discomfort at the request of Wilfrid Lund, [place].  She had a left carotid dissection in 2013.  Managed medically.  She has a history of several medical issues including Horner syndrome, hypertension and prediabetes.  She has had some pain in the center of the chest , worse with exertion.  It gets better with rest.  SHe has been trying to increase her exerciser to lose weight.  Mild pressure sensation, not severe.  No associated sx.    Walking more to try to lose weight .  Sx usually come on with walking up stairs.   Leg pains at random.  Not related to walking.   Denies :  Dizziness. Leg edema. Nitroglycerin use. Orthopnea. Palpitations. Paroxysmal nocturnal dyspnea. Syncope.     Past Medical History:  Diagnosis Date  . Anxiety   . Eczema   . Gallstones   . Horners syndrome   . Hypertension   . Infertility, female   . Insomnia   . Prediabetes   . Taste disorder   . Varicose veins of legs   . Vision abnormalities   . Vitamin D deficiency     Past Surgical History:  Procedure Laterality Date  . CESAREAN SECTION  November 2004     Current Outpatient Medications  Medication Sig Dispense Refill  . acetaZOLAMIDE (DIAMOX) 250 MG tablet Take 1 tablet (250 mg total) by mouth 2 (two) times daily. 60 tablet 11  . ALPRAZolam (XANAX) 0.5 MG tablet Take 0.5-1 tablets by mouth daily as needed.  0  . amitriptyline (ELAVIL) 50 MG tablet Take 50 mg by mouth at bedtime.    Marland Kitchen aspirin EC 81 MG tablet Take by mouth.    . BYSTOLIC 10 MG tablet Take 10  mg by mouth daily.  5  . diphenhydrAMINE (BENADRYL) 25 mg capsule Take 25 mg by mouth at bedtime as needed.    . gabapentin (NEURONTIN) 600 MG tablet Take 600 mg by mouth 3 (three) times daily.    . hydrochlorothiazide (HYDRODIURIL) 12.5 MG tablet Take 12.5 mg by mouth daily.  1  . losartan (COZAAR) 100 MG tablet Take 100 mg by mouth daily.  5  . Multiple Vitamins tablet Take by mouth.    . pregabalin (LYRICA) 75 MG capsule Take 1 capsule (75 mg total) by mouth 2 (two) times daily. 60 capsule 11  . triamcinolone cream (KENALOG) 0.1 % Apply topically.    . valsartan (DIOVAN) 320 MG tablet Take 320 mg by mouth daily.    Marland Kitchen venlafaxine XR (EFFEXOR-XR) 75 MG 24 hr capsule Take 75 mg by mouth daily.  5  . zolpidem (AMBIEN) 5 MG tablet Take 5 mg by mouth as needed.  2   No current facility-administered medications for this visit.    Allergies:   Codeine and Penicillins    Social History:  The patient  reports that she has never smoked. She has never used smokeless tobacco. She reports current  alcohol use. She reports previous drug use.   Family History:  The patient's family history includes Hypertension in her father and mother; Stroke in her father and mother.    ROS:  Please see the history of present illness.   Otherwise, review of systems are positive for chest pain.   All other systems are reviewed and negative.    PHYSICAL EXAM: VS:  BP 122/84   Pulse 76   Ht 5\' 6"  (1.676 m)   Wt 195 lb 3.2 oz (88.5 kg)   SpO2 99%   BMI 31.51 kg/m  , BMI Body mass index is 31.51 kg/m. GEN: Well nourished, well developed, in no acute distress  HEENT: normal  Neck: no JVD, carotid bruits, or masses Cardiac: RRR; no murmurs, rubs, or gallops,no edema  Respiratory:  clear to auscultation bilaterally, normal work of breathing GI: soft, nontender, nondistended, + BS MS: no deformity or atrophy  Skin: warm and dry, no rash Neuro:  Strength and sensation are intact Psych: euthymic mood, full  affect   EKG:   The ekg ordered today demonstrates NSR, no ST changes   Recent Labs: No results found for requested labs within last 8760 hours.   Lipid Panel No results found for: CHOL, TRIG, HDL, CHOLHDL, VLDL, LDLCALC, LDLDIRECT   Other studies Reviewed: Additional studies/ records that were reviewed today with results demonstrating: .   ASSESSMENT AND PLAN:  1. Chest discomfort: Some typical and atypical features.  Plan for CTA coronaries.  Metoprolol 50 mg the day of the study.  Had carotid dissection in 2013.  COntinue baby aspirin.  2. Hypertension: The current medical regimen is effective;  continue present plan and medications. 3. Prediabetes: Whole food, plant-based diet recommended.   75. FAmily h/o CAD: Grandfather had MI; parents have had strokes.    Current medicines are reviewed at length with the patient today.  The patient concerns regarding her medicines were addressed.  The following changes have been made:  No change  Labs/ tests ordered today include:  No orders of the defined types were placed in this encounter.   Recommend 150 minutes/week of aerobic exercise Low fat, low carb, high fiber diet recommended  Disposition:   FU based on CT scan   Signed, Larae Grooms, MD  04/13/2020 11:32 AM    [facility] [place], [place], [place]  [address] Phone: [phone]; Fax: [phone]

## 2020-04-13 NOTE — Patient Instructions (Signed)
Medication Instructions:  Your physician recommends that you continue on your current medications as directed. Please refer to the Current Medication list given to you today.  *If you need a refill on your cardiac medications before your next appointment, please call your pharmacy*   Lab Work: None today  If you have labs (blood work) drawn today and your tests are completely normal, you will receive your results only by: Marland Kitchen MyChart Message (if you have MyChart) OR . A paper copy in the mail If you have any lab test that is abnormal or we need to change your treatment, we will call you to review the results.   Testing/Procedures: Your physician has requested that you have cardiac CT. Cardiac computed tomography (CT) is a painless test that uses an x-ray machine to take clear, detailed pictures of your heart. For further information please visit [email or web address] Please follow instruction sheet as given.   Follow-Up: Based on test results   Other Instructions Your cardiac CT will be scheduled at one of the below locations:   [facility] [address] [place], [place] [address] [phone]  OR  [facility] [address] [place], [place] [address] [phone]  If scheduled at [facility] [place], please arrive at the [facility] main entrance of [facility] 30 minutes prior to test start time. Proceed to the [facility] Radiology Department (first floor) to check-in and test prep.  If scheduled at [facility], please arrive 15 mins early for check-in and test prep.  Please follow these instructions carefully (unless otherwise directed):   On the Night Before the Test: . Be sure to Drink plenty of water. . Do not consume any caffeinated/decaffeinated beverages or chocolate 12 hours prior to your test. . Do not take any antihistamines 12 hours prior to your test.   On the  Day of the Test: . Drink plenty of water. Do not drink any water within one hour of the test. . Do not eat any food 4 hours prior to the test. . You may take your regular medications prior to the test.  . Take metoprolol (Lopressor) 50 MG two hours prior to test in addition to your regular dose of bystolic. Marland Kitchen HOLD Hydrochlorothiazide morning of the test. . FEMALES- please wear underwire-free bra if available     After the Test: . Drink plenty of water. . After receiving IV contrast, you may experience a mild flushed feeling. This is normal. . On occasion, you may experience a mild rash up to 24 hours after the test. This is not dangerous. If this occurs, you can take Benadryl 25 mg and increase your fluid intake. . If you experience trouble breathing, this can be serious. If it is severe call 911 IMMEDIATELY. If it is mild, please call our office.  Once we have confirmed authorization from your insurance company, we will call you to set up a date and time for your test.   For non-scheduling related questions, please contact the cardiac imaging nurse navigator should you have any questions/concerns: Rockwell [place], RN Navigator Cardiac Imaging [facility] [phone] office  For scheduling needs, including cancellations and rescheduling, please call [phone].

## 2020-04-16 ENCOUNTER — Other Ambulatory Visit: Payer: Self-pay

## 2020-04-16 DIAGNOSIS — R072 Precordial pain: Secondary | ICD-10-CM

## 2020-05-27 ENCOUNTER — Telehealth (HOSPITAL_COMMUNITY): Payer: Self-pay | Admitting: *Deleted

## 2020-05-27 NOTE — Telephone Encounter (Signed)

## 2020-05-27 NOTE — Telephone Encounter (Signed)
Attempted to call patient regarding upcoming cardiac CT appointment. °Left message on voicemail with name and callback number ° °Evoleth Nordmeyer Tai RN Navigator Cardiac Imaging °[facility] °[phone] Office °[phone] Cell ° °

## 2020-05-29 ENCOUNTER — Ambulatory Visit (HOSPITAL_COMMUNITY)
Admission: RE | Admit: 2020-05-29 | Discharge: 2020-05-29 | Disposition: A | Discharge status: Home / Self Care | Payer: PRIVATE HEALTH INSURANCE | Source: Ambulatory Visit | Attending: Interventional Cardiology | Admitting: Interventional Cardiology

## 2020-05-29 ENCOUNTER — Other Ambulatory Visit: Payer: Self-pay

## 2020-05-29 DIAGNOSIS — R072 Precordial pain: Secondary | ICD-10-CM | POA: Insufficient documentation

## 2020-05-29 MED ORDER — IOHEXOL 350 MG/ML SOLN
80.0000 mL | Freq: Once | INTRAVENOUS | Status: AC | PRN
  Administered 2020-05-29: 80 mL via INTRAVENOUS

## 2020-05-29 MED ORDER — NITROGLYCERIN 0.4 MG SL SUBL
SUBLINGUAL_TABLET | SUBLINGUAL | Status: AC
  Filled 2020-05-29: qty 2

## 2020-05-29 MED ORDER — NITROGLYCERIN 0.4 MG SL SUBL
0.8000 mg | SUBLINGUAL_TABLET | Freq: Once | SUBLINGUAL | Status: AC
  Administered 2020-05-29: 0.8 mg via SUBLINGUAL

## 2020-06-30 IMAGING — CT CT HEART MORP W/ CTA COR W/ SCORE W/ CA W/CM &/OR W/O CM
4 of 7 series · 8 of 20 positions shown, 9 images · IV contrast (APPLIED)
Comparison: None.
COMPARISON: None.

Addendum:
EXAM:
OVER-READ INTERPRETATION  CT CHEST

The following report is an over-read performed by radiologist Dr.
Goagamang Rendo [REDACTED] on 05/29/2020. This
over-read does not include interpretation of cardiac or coronary
anatomy or pathology. The coronary calcium score/coronary CTA
interpretation by the cardiologist is attached.
CLINICAL DATA: Chest pain
Cardiac CTA
MEDICATIONS:
Sub lingual nitro. 4mg x 2
TECHNIQUE: The patient was scanned on a Siemens [REDACTED]ice scanner. Gantry
rotation speed was 250 msecs. Collimation was 0.6 mm. A 100 kV
prospective scan was triggered in the ascending thoracic aorta at
35-75% of the R-R interval. Average HR during the scan was 60 bpm.
The 3D data set was interpreted on a dedicated work station using
MPR, MIP and VRT modes. A total of 80cc of contrast was used.

[Series 6: best diast 73 % · axial · 0.39mm/px · z∈[+1115,+1156]mm · 2 of 303 slices shown]
[im 101/303  vessel]
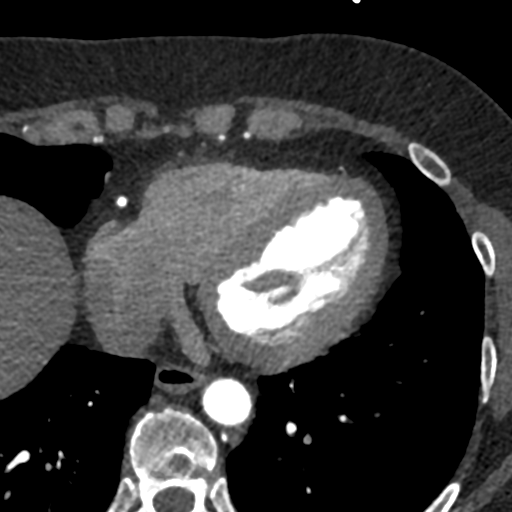
[im 202/303  vessel]
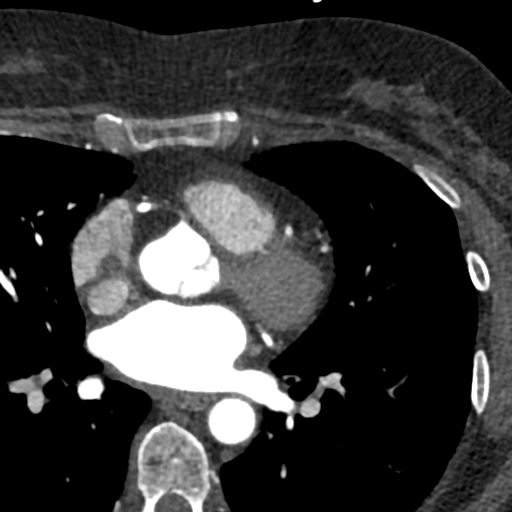

[Series 7: best syst · axial · 0.39mm/px · z∈[+1115,+1156]mm · 2 of 303 slices shown, 3 images]
[im 101/303  vessel]
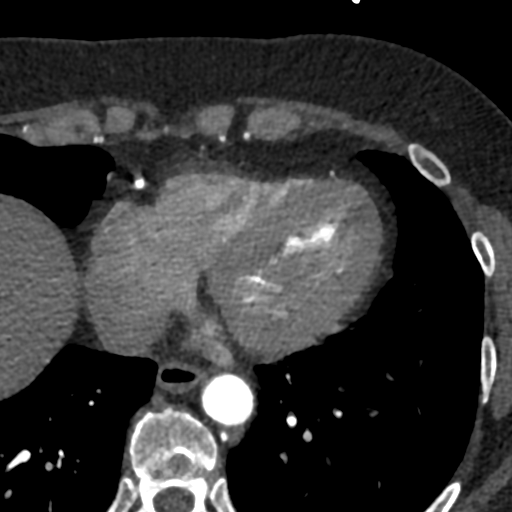
[im 101/303  lung]
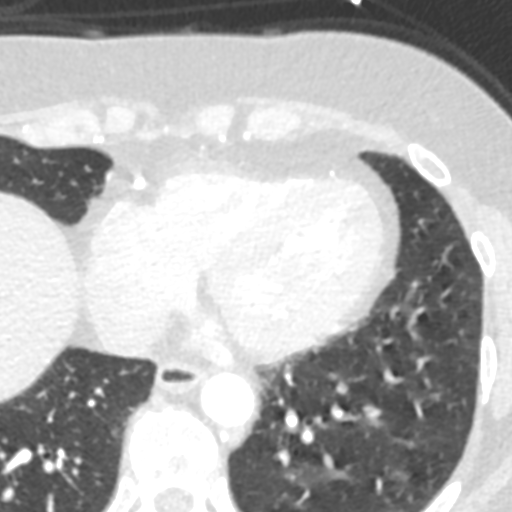
[im 202/303  vessel]
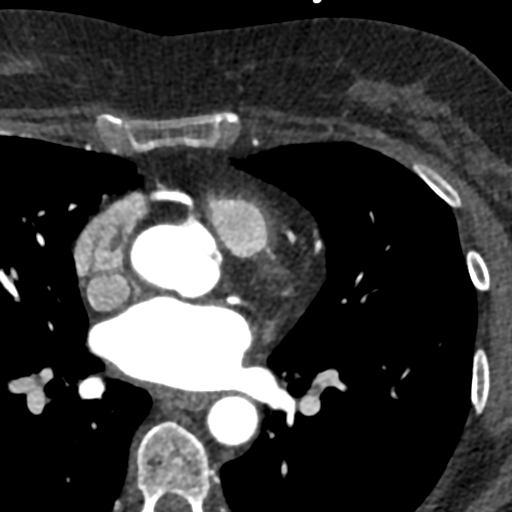

[Series 9: ts diast sharp · axial · 0.39mm/px · z∈[+1115,+1156]mm · 2 of 303 slices shown]
[im 101/303  lung]
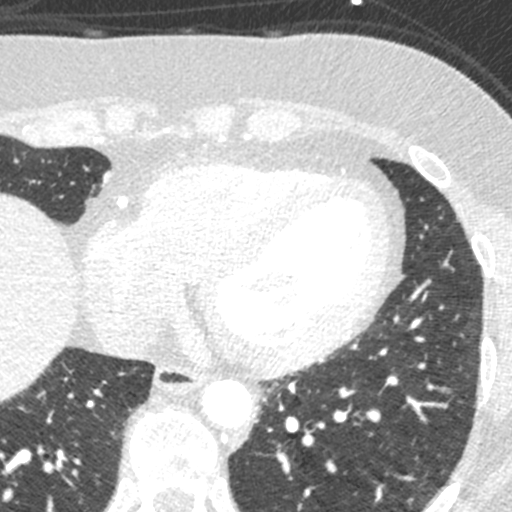
[im 202/303  lung]
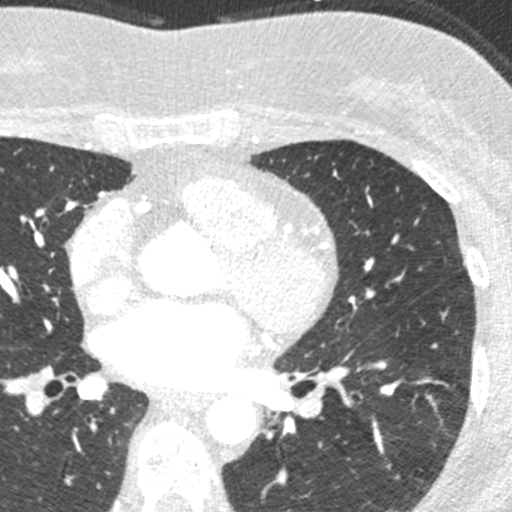

[Series 10: ts syst sharp · axial · 0.39mm/px · z∈[+1115,+1156]mm · 2 of 303 slices shown]
[im 101/303  lung]
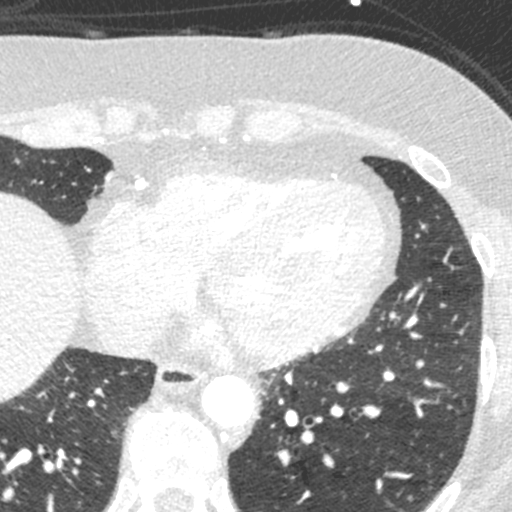
[im 202/303  lung]
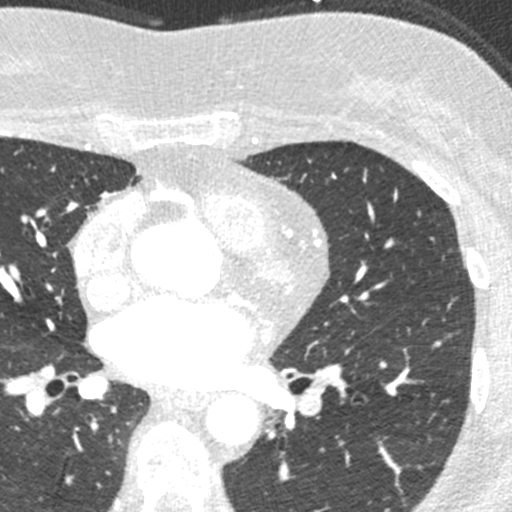

[8 of 20 positions shown; findings below may reference images not displayed]

FINDINGS: Within the visualized portions of the thorax there are no suspicious
appearing pulmonary nodules or masses, there is no acute
consolidative airspace disease, no pleural effusions, no
pneumothorax and no lymphadenopathy. Visualized portions of the
upper abdomen are unremarkable. There are no aggressive appearing
lytic or blastic lesions noted in the visualized portions of the
skeleton.
IMPRESSION: No significant incidental noncardiac findings are noted.
FINDINGS: Non-cardiac: See separate report from [REDACTED].

Pulmonary veins drain normally to the left atrium.

Calcium Score: 0 Agatston units.

Coronary Arteries: Right dominant with no anomalies

LM: No plaque or stenosis.

LAD system:  No plaque or stenosis.

Circumflex system: No plaque or stenosis.

RCA system: No plaque or stenosis.
IMPRESSION: 1. Coronary artery calcium score 0 Agatston units, suggesting low
risk for future cardiac events.

2.  No significant coronary disease on CT angiography.

Sanj Pawlak

*** End of Addendum ***
EXAM:
OVER-READ INTERPRETATION  CT CHEST

The following report is an over-read performed by radiologist Dr.
Goagamang Rendo [REDACTED] on 05/29/2020. This
over-read does not include interpretation of cardiac or coronary
anatomy or pathology. The coronary calcium score/coronary CTA
interpretation by the cardiologist is attached.
FINDINGS: Within the visualized portions of the thorax there are no suspicious
appearing pulmonary nodules or masses, there is no acute
consolidative airspace disease, no pleural effusions, no
pneumothorax and no lymphadenopathy. Visualized portions of the
upper abdomen are unremarkable. There are no aggressive appearing
lytic or blastic lesions noted in the visualized portions of the
skeleton.
IMPRESSION: No significant incidental noncardiac findings are noted.

## 2020-08-07 NOTE — Telephone Encounter (Signed)
Disregard opened in error °

## 2024-09-26 ENCOUNTER — Ambulatory Visit (INDEPENDENT_AMBULATORY_CARE_PROVIDER_SITE_OTHER)

## 2024-09-26 DIAGNOSIS — L821 Other seborrheic keratosis: Secondary | ICD-10-CM

## 2024-09-26 DIAGNOSIS — L988 Other specified disorders of the skin and subcutaneous tissue: Secondary | ICD-10-CM

## 2024-09-26 DIAGNOSIS — W908XXA Exposure to other nonionizing radiation, initial encounter: Secondary | ICD-10-CM

## 2024-09-26 DIAGNOSIS — D1801 Hemangioma of skin and subcutaneous tissue: Secondary | ICD-10-CM

## 2024-09-26 DIAGNOSIS — L814 Other melanin hyperpigmentation: Secondary | ICD-10-CM

## 2024-09-26 DIAGNOSIS — D229 Melanocytic nevi, unspecified: Secondary | ICD-10-CM

## 2024-09-26 DIAGNOSIS — L439 Lichen planus, unspecified: Secondary | ICD-10-CM

## 2024-09-26 DIAGNOSIS — D489 Neoplasm of uncertain behavior, unspecified: Secondary | ICD-10-CM | POA: Diagnosis not present

## 2024-09-26 DIAGNOSIS — L578 Other skin changes due to chronic exposure to nonionizing radiation: Secondary | ICD-10-CM

## 2024-09-26 MED ORDER — TRETINOIN 0.025 % EX CREA: TOPICAL_CREAM | Freq: Every day | CUTANEOUS | 5 refills | Status: AC

## 2024-09-26 NOTE — Patient Instructions (Addendum)
 Topical Retinoids:    What are Topical Retinoids? - These are prescription topical medications, used to treat acne, sun damage, fine wrinkles, and several other skin changes on the face. Medications in this family include tretinoin/Retin-A, adapalene/Differin, and Tazorac, among others.   Instructions for Use: 1. These medications should be used at night because some can be inactivated by sunlight. 2. Wash the face with gentle cleanser such as Cetaphil (no salicylic acid or other acne-washes, which can break down the medication), and let it dry completely before applying the topical retinoid.  It is best to wait 10-15 minutes after washing to apply the medicine. 3. Use a small (pea sized) amount of medication with each application. This should cover the whole face.  Apply by dotting small amounts on the skin, and then blend. Avoid applying medication to the areas right around eyes and lips.   4. Topical retinoids can be drying/irritating to the skin, which varies from person to person, and with the strength and type of the medication used. We often recommend starting to use the topical retinoid medication every 2-3 nights, and increase the frequency gradually up to nightly as tolerated over several weeks' time. This irritation typically improves as time goes by, and your skin will respond better if you are able to use the medication more consistently.  5. If you are still experiencing dryness/irritation, you may apply a moisturizer after your toipcal retinoid, such as Cerave PM, Cetaphil, or other bland facial moisturizer. Do not use other night creams or anti-aging creams.  6. These medications take 6 weeks of consistent use to see benefits.    A Note on Cosmetic Use: - Please note, if you are over the age of 42, insurance will typically not cover these medications. If they are recommended to you for non-medically necessary reasons, you may choose to pay for the medication out of pocket.  Prices vary, but a tube of generic tretinoin can often be purchased for ~$60-100 (this size often lasts several months). This varies considerably, and we recommended that you check www.goodrx.com for prescription discount coupons and to compare prices across pharmacies.   These medications should not be used if you are pregnant, planning to become pregnant soon, or breastfeeding.   Please do not hesitate to contact our office with any questions regarding your medication.    Wound Care Instructions  Cleanse wound gently with soap and water once a day then pat dry with clean gauze. Apply a thin coat of Petrolatum (petroleum jelly, Vaseline) over the wound (unless you have an allergy to this). We recommend that you use a new, sterile tube of Vaseline. Do not pick or remove scabs. Do not remove the yellow or white healing tissue from the base of the wound.  Cover the wound with fresh, clean, nonstick gauze and secure with paper tape. You may use Band-Aids in place of gauze and tape if the wound is small enough, but would recommend trimming much of the tape off as there is often too much. Sometimes Band-Aids can irritate the skin.  You should call the office for your biopsy report after 1 week if you have not already been contacted.  If you experience any problems, such as abnormal amounts of bleeding, swelling, significant bruising, significant pain, or evidence of infection, please call the office immediately.  FOR ADULT SURGERY PATIENTS: If you need something for pain relief you may take 1 extra strength Tylenol (acetaminophen) AND 2 Ibuprofen (200mg  each) together every 4 hours  as needed for pain. (do not take these if you are allergic to them or if you have a reason you should not take them.) Typically, you may only need pain medication for 1 to 3 days.     Due to recent changes in healthcare laws, you may see results of your pathology and/or laboratory studies on MyChart before the doctors  have had a chance to review them. We understand that in some cases there may be results that are confusing or concerning to you. Please understand that not all results are received at the same time and often the doctors may need to interpret multiple results in order to provide you with the best plan of care or course of treatment. Therefore, we ask that you please give us  2 business days to thoroughly review all your results before contacting the office for clarification. Should we see a critical lab result, you will be contacted sooner.   If You Need Anything After Your Visit  If you have any questions or concerns for your doctor, please call our main line at [phone] and press option 4 to reach your doctor's medical assistant. If no one answers, please leave a voicemail as directed and we will return your call as soon as possible. Messages left after 4 pm will be answered the following business day.   You may also send us  a message via MyChart. We typically respond to MyChart messages within 1-2 business days.  For prescription refills, please ask your pharmacy to contact our office. Our fax number is [phone].  If you have an urgent issue when the clinic is closed that cannot wait until the next business day, you can page your doctor at the number below.    Please note that while we do our best to be available for urgent issues outside of office hours, we are not available 24/7.   If you have an urgent issue and are unable to reach us , you may choose to seek medical care at your doctor's office, retail clinic, urgent care center, or emergency room.  If you have a medical emergency, please immediately call 911 or go to the emergency department.  Pager Numbers  - Dr. Hester: [phone]  - Dr. Jackquline: [phone]  - Dr. Claudene: [phone]   - Dr. Raymund: [phone]  In the event of inclement weather, please call our main line at [phone] for an update on the status  of any delays or closures.  Dermatology Medication Tips: Please keep the boxes that topical medications come in in order to help keep track of the instructions about where and how to use these. Pharmacies typically print the medication instructions only on the boxes and not directly on the medication tubes.   If your medication is too expensive, please contact our office at [phone] option 4 or send us  a message through MyChart.   We are unable to tell what your co-pay for medications will be in advance as this is different depending on your insurance coverage. However, we may be able to find a substitute medication at lower cost or fill out paperwork to get insurance to cover a needed medication.   If a prior authorization is required to get your medication covered by your insurance company, please allow us  1-2 business days to complete this process.  Drug prices often vary depending on where the prescription is filled and some pharmacies may offer cheaper prices.  The website www.goodrx.com contains coupons for medications through different pharmacies. The prices  here do not account for what the cost may be with help from insurance (it may be cheaper with your insurance), but the website can give you the price if you did not use any insurance.  - You can print the associated coupon and take it with your prescription to the pharmacy.  - You may also stop by our office during regular business hours and pick up a GoodRx coupon card.  - If you need your prescription sent electronically to a different pharmacy, notify our office through [facility] or by phone at [phone] option 4.     Si Usted Necesita Algo Despus de Su Visita  Tambin puede enviarnos un mensaje a travs de Clinical cytogeneticist. Por lo general respondemos a los mensajes de MyChart en el transcurso de 1 a 2 das hbiles.  Para renovar recetas, por favor pida a su farmacia que se ponga en contacto con nuestra oficina.  [place] de fax es [place] [phone].  Si tiene un asunto urgente cuando la clnica est cerrada y que no puede esperar hasta el siguiente da hbil, puede llamar/localizar a su doctor(a) al nmero que aparece a continuacin.   Por favor, tenga en cuenta que aunque hacemos todo lo posible para estar disponibles para asuntos urgentes fuera del horario de [employer], no estamos disponibles las 24 horas del da, los 7 [address]  [address] de la [place].   Si tiene un problema urgente y no puede comunicarse con nosotros, puede optar por buscar atencin mdica  en el consultorio de su doctor(a), en una clnica privada, en un centro de atencin urgente o en una sala de emergencias.  Si tiene Engineer, drilling, por favor llame inmediatamente al 911 o vaya a la sala de emergencias.  Nmeros de bper  - Dr. Hester: [phone]  - Dra. Jackquline: [phone]  - Dr. Claudene: [phone]  - Dra. Kitts: [phone]  En caso de inclemencias del [place], por favor llame a nuestra lnea principal al [phone] para una actualizacin sobre el estado de cualquier retraso o cierre.  Consejos para la medicacin en dermatologa: Por favor, guarde las cajas en las que vienen los medicamentos de uso tpico para ayudarle a seguir las instrucciones sobre dnde y cmo usarlos. Las farmacias generalmente imprimen las instrucciones del medicamento slo en las cajas y no directamente en los tubos del [employer].   Si su medicamento es muy caro, por favor, pngase en contacto con landry rieger llamando al [phone] y presione la opcin 4 o envenos un mensaje a travs de Clinical cytogeneticist.   No podemos decirle cul ser su copago por los medicamentos por adelantado ya que esto es diferente dependiendo de la cobertura de su seguro. Sin embargo, es posible que podamos encontrar un medicamento sustituto a Audiological scientist un formulario para que el seguro cubra el medicamento que se considera necesario.   Si se requiere una  autorizacin previa para que su compaa de seguros malta su medicamento, por favor permtanos de 1 a 2 das hbiles para completar este proceso.  Los precios de los medicamentos varan con frecuencia dependiendo del Environmental consultant de dnde se surte la receta y alguna farmacias pueden ofrecer precios ms baratos.  El sitio web www.goodrx.com tiene cupones para medicamentos de Health and safety inspector. Los precios aqu no tienen en cuenta lo que podra costar con la ayuda del seguro (puede ser ms barato con su seguro), pero el sitio web puede darle el precio si no utiliz Tourist information centre manager.  - Puede imprimir el cupn correspondiente y llevarlo  con su receta a la farmacia.  - Tambin puede pasar por nuestra oficina durante el horario de atencin regular y Education officer, museum una tarjeta de cupones de GoodRx.  - Si necesita que su receta se enve electrnicamente a una farmacia diferente, informe a nuestra oficina a travs de MyChart de [place] o por telfono llamando al [phone] y presione la opcin 4.

## 2024-09-26 NOTE — Progress Notes (Signed)
 Subjective   Brenda Rubio is a 63 y.o. female who presents for the following: Lesion(s) of concern . Patient is new patient  Today patient reports: Area of concern on her chest Area of concern on her left lower extremity   Review of Systems:    No other skin or systemic complaints except as noted in HPI or Assessment and Plan.  The following portions of the chart were reviewed this encounter and updated as appropriate: medications, allergies, medical history  Relevant Medical History:  Family history of skin cancer - Father; BCC, Maternal Grandmother, Melanoma   Objective  Well appearing patient in no apparent distress; mood and affect are within normal limits. Examination was performed of the: Sun Exposed Exam: Scalp, head, eyes, ears, nose, lips, neck, upper extremities, hands, fingers, fingernails  Examination notable for: Angioma(s): Scattered red vascular papule(s)  , Lentigo/lentigines: Scattered pigmented macules that are tan to brown in color and are somewhat non-uniform in shape and concentrated in the sun-exposed areas, Nevus/nevi: Scattered well-demarcated, regular, pigmented macule(s) and/or papule(s)  , Seborrheic Keratosis(es): Stuck-on appearing keratotic papule(s) on the trunk, some  irritated with redness, crusting, edema, and/or partial avulsion, Actinic Damage/Elastosis: chronic sun damage: dyspigmentation, telangiectasia, and wrinkling  Examination limited by: Clothing and Patient deferred removal     Chest - Medial (Center) 8mm pink papule    Assessment & Plan   BENIGN SKIN FINDINGS  - Lentigines  - Seborrheic keratosis  - Angioma  - Nevi  - Reassurance provided regarding the benign appearance of lesions noted on exam today; no treatment is indicated in the absence of symptoms/changes. - Reinforced importance of photoprotective strategies including liberal and frequent sunscreen use of a broad-spectrum SPF 30 or greater, use of protective clothing,  and sun avoidance for prevention of cutaneous malignancy and photoaging.  Counseled patient on the importance of regular self-skin monitoring as well as routine clinical skin examinations as scheduled.   ACTINIC DAMAGE - Chronic condition, secondary to cumulative UV/sun exposure - Recommend daily broad spectrum sunscreen SPF 30+ to sun-exposed areas, reapply every 2 hours as needed.  - Staying in the shade or wearing long sleeves, sun glasses (UVA+UVB protection) and wide brim hats (4-inch brim around the entire circumference of the hat) are also recommended for sun protection.  - Call for new or changing lesions.  Elastosis/Lentigines start tretinoin 0.025% cream in the evening. Educated patient about proper use and potential side effects, including dryness, irritation, sun sensitivity, and transient worsening of acne.  Discussed cosmetic lasers/Botox/Fillers if not pleased with result of  topical treatments Recommend OTC Eucerin Radiant Tone line  Level of service outlined above   Procedures, orders, diagnosis for this visit:  NEOPLASM OF UNCERTAIN BEHAVIOR Chest - Medial (Center) Skin / nail biopsy Type of biopsy: tangential   Informed consent: discussed and consent obtained   Timeout: patient name, date of birth, surgical site, and procedure verified   Procedure prep:  Patient was prepped and draped in usual sterile fashion Prep type:  Isopropyl alcohol Anesthesia: the lesion was anesthetized in a standard fashion   Anesthetic:  1% lidocaine w/ epinephrine 1-100,000 buffered w/ 8.4% NaHCO3 Instrument used: DermaBlade   Hemostasis achieved with: pressure and aluminum chloride   Outcome: patient tolerated procedure well   Post-procedure details: sterile dressing applied and wound care instructions given   Dressing type: bandage and petrolatum     Neoplasm of uncertain behavior -     Skin / nail biopsy  Other orders -  Tretinoin; Apply topically at bedtime. Apply 2-3 times  weekly at night to dry skin after cleaning. Increase frequency up to nightly as tolerated.  Dispense: 20 g; Refill: 5    Return to clinic: Return if symptoms worsen or fail to improve.  I, Emerick Ege, CMA am acting as scribe for Lauraine JAYSON Kanaris, MD   Documentation: I have reviewed the above documentation for accuracy and completeness, and I agree with the above.  Lauraine JAYSON Kanaris, MD

## 2024-10-03 ENCOUNTER — Ambulatory Visit: Payer: Self-pay

## 2024-10-03 LAB — SURGICAL PATHOLOGY

## 2024-10-03 NOTE — Progress Notes (Signed)
 LMTRC

## 2024-10-09 NOTE — Telephone Encounter (Signed)
-----   Message from Lauraine JAYSON Kanaris sent at 10/03/2024  2:38 PM EDT -----    1. Skin, chest - medial (center) :       LICHEN PLANUS-LIKE KERATOSIS   Please notify patient with below plan: Benign, observe.   ----- Message ----- From: Interface, Lab [facility] Sent: 10/03/2024   2:35 PM EDT To: Lauraine JAYSON Kanaris, MD

## 2024-10-09 NOTE — Telephone Encounter (Signed)
 Left message to return my call.

## 2024-10-10 NOTE — Telephone Encounter (Signed)
-----   Message from Lauraine JAYSON Kanaris sent at 10/03/2024  2:38 PM EDT -----    1. Skin, chest - medial (center) :       LICHEN PLANUS-LIKE KERATOSIS   Please notify patient with below plan: Benign, observe.   ----- Message ----- From: Interface, Lab [facility] Sent: 10/03/2024   2:35 PM EDT To: Lauraine JAYSON Kanaris, MD

## 2024-10-10 NOTE — Telephone Encounter (Signed)
 Left voicemail to return my call

## 2024-10-15 NOTE — Telephone Encounter (Signed)
-----   Message from Lauraine JAYSON Kanaris sent at 10/03/2024  2:38 PM EDT -----    1. Skin, chest - medial (center) :       LICHEN PLANUS-LIKE KERATOSIS   Please notify patient with below plan: Benign, observe.   ----- Message ----- From: Interface, Lab [facility] Sent: 10/03/2024   2:35 PM EDT To: Lauraine JAYSON Kanaris, MD

## 2024-10-15 NOTE — Telephone Encounter (Signed)
 Left pt msg to call for bx results/sh

## 2024-10-16 NOTE — Telephone Encounter (Signed)
 Left voicemail to return my call. Letter sent with pathology results.

## 2024-10-16 NOTE — Telephone Encounter (Signed)
-----   Message from Lauraine JAYSON Kanaris sent at 10/03/2024  2:38 PM EDT -----    1. Skin, chest - medial (center) :       LICHEN PLANUS-LIKE KERATOSIS   Please notify patient with below plan: Benign, observe.   ----- Message ----- From: Interface, Lab [facility] Sent: 10/03/2024   2:35 PM EDT To: Lauraine JAYSON Kanaris, MD
